# Patient Record
Sex: Male | Born: 2000 | Hispanic: No | Marital: Single | Smoking: Never smoker
Health system: Southern US, Community
[De-identification: ages and names within clinical notes are randomized; demographics above are authoritative.]

## PROBLEM LIST (undated history)

## (undated) DIAGNOSIS — G4733 Obstructive sleep apnea (adult) (pediatric): Secondary | ICD-10-CM

## (undated) DIAGNOSIS — E669 Obesity, unspecified: Secondary | ICD-10-CM

## (undated) DIAGNOSIS — R7303 Prediabetes: Secondary | ICD-10-CM

## (undated) HISTORY — DX: Obesity, unspecified: E66.9

## (undated) HISTORY — DX: Prediabetes: R73.03

## (undated) HISTORY — PX: MYRINGOTOMY: SUR874

## (undated) HISTORY — DX: Obstructive sleep apnea (adult) (pediatric): G47.33

## (undated) HISTORY — DX: Morbid (severe) obesity due to excess calories: E66.01

---
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
## 2019-02-02 ENCOUNTER — Other Ambulatory Visit: Payer: Self-pay

## 2019-02-02 ENCOUNTER — Encounter: Payer: Medicaid Other | Attending: Nurse Practitioner | Admitting: Registered"

## 2019-02-02 ENCOUNTER — Encounter: Payer: Self-pay | Admitting: Registered"

## 2019-02-02 DIAGNOSIS — R7303 Prediabetes: Secondary | ICD-10-CM | POA: Insufficient documentation

## 2019-02-02 NOTE — Patient Instructions (Signed)
Instructions/Goals:  Make sure to get in three meals per day. Try to have balanced meals like the My Plate example (see handout). Include lean proteins, vegetables, fruits, and whole grains at meals.   Starting Goal: Include a non-starchy vegetable at least 1 time per day (see list).  Try to include more water and less sugar sweetened beverages.  Try to limit high sodium foods:   5% or less daily value per serving  = low   20% or greater daily value per serving = high    Make physical activity a part of your week. Try to include at least 30 minutes of physical activity 5 days each week or at least 150 minutes per week. Regular physical activity promotes overall health-including helping to reduce risk for heart disease and diabetes, promoting mental health, and helping [place] sleep better.   Starting Goal: walk 4 days per week for 30-60 minutes each time.

## 2019-02-02 NOTE — Progress Notes (Signed)
Medical Nutrition Therapy:  Appt start time: 0930 end time:  1030.  Assessment:  Primary concerns today: Pt referred due to prediabetes and weight management. Pt present for appointment alone. Pt's mother waited in lobby. Pt is taking Metformin for elevated blood sugar and is taking lasix for swelling. Pt reports he is here to learn how to help prevent his prediabetes from developing into diabetes.   Sleep Routine: Wakes around 10:30 AM typically, goes to bed around 2 AM. Pt gets around 8.5 hours sleep. Denies any trouble sleeping. Pt is supposed to be scheduled for a sleep apnea screening.   Food Allergies/Intolerances: None reported.   GI Concerns: None reported.   Pertinent Lab Values: 12/27/18: Hgb A1c: 6.2  Weight Hx: See chart.   Preferred Learning Style:   No preference indicated   Learning Readiness:   Ready  MEDICATIONS: See list. Reviewed.    DIETARY INTAKE:  Usual eating pattern includes 3 meals and 1 snack per day.   Common foods: eggs.  Avoided foods: rice; most vegetables.     Typical Snacks: chips.     Typical Beverages: ~32 oz water; lemonade.  Location of Meals: kitchen at a table. Breakfast and lunch alone and dinner with family.   Electronics Present at [employer]: Yes: phone  Preferred/Accepted Foods:  Grains/Starches: likes whole grains. Most grains and starches apart from rice.  Proteins: most meats and beans, peanut butter.  Vegetables: starchy vegetables-potatoes, corn, beans. Non-starchy: green beans   Fruits: most fruits.  Dairy: accepts most dairy.  Beverages: water; lemonade; will drink milk  24-hr recall: Woke up at 1 PM  B ( AM): None reported.  Snk ( AM): None reported.  L (1 PM): 3 pieces of sausage, 2 eggs, 2 pieces white bread toasted, orange juice Snk ( PM): None reported.  D ( PM): [employer], fries, chicken nuggets, sweet tea Snk ( PM): None reported.  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    Progress Towards Goal(s):  In progress.   Nutritional Diagnosis:  NB-1.1 Food and nutrition-related knowledge deficit As related to relationship between insulin resistance/prediabetes and dietary intake .  As evidenced by no prior prediabetes education . NI-5.11.1 Predicted suboptimal nutrient intake As related to vegetables and whole grians.  As evidenced by pt's reported dietary intake and habits .    Intervention:  Nutrition counseling provided. Dietitian discussed pt's hgb a1c and relationship between insulin resistance/blood sugar and dietary intake. Provided education on balanced nutrition goals and benefits of physical activity on overall health and blood sugar management. Discussed limiting high sodium foods d/t fluid retention and provided education on reading labels to choose lower sodium foods. Worked with pt to set pt led goals. Pt appeared agreeable to information/goals discussed.   Instructions/Goals:  Make sure to get in three meals per day. Try to have balanced meals like the My Plate example (see handout). Include lean proteins, vegetables, fruits, and whole grains at meals.   Starting Goal: Include a non-starchy vegetable at least 1 time per day (see list).  Try to include more water and less sugar sweetened beverages.  Try to limit high sodium foods:   5% or less daily value per serving  = low   20% or greater daily value per serving = high    Make physical activity a part of your week. Try to include  at least 30 minutes of physical activity 5 days each week or at least 150 minutes per week. Regular physical activity promotes overall health-including helping to reduce risk for heart disease and diabetes, promoting mental health, and helping us sleep better.   Starting Goal: walk 4 days per week for 30-60 minutes each time.   Teaching Method Utilized:   Visual Auditory  Handouts given during visit include:  Balanced plate and food list.   Barriers to learning/adherence to lifestyle change: None indicated.   Demonstrated degree of understanding via:  Teach Back   Monitoring/Evaluation:  Dietary intake, exercise, and body weight in 1 month(s).

## 2019-03-02 ENCOUNTER — Encounter: Payer: Medicaid Other | Attending: Nurse Practitioner | Admitting: Registered"

## 2019-03-02 ENCOUNTER — Encounter: Payer: Self-pay | Admitting: Registered"

## 2019-03-02 ENCOUNTER — Other Ambulatory Visit: Payer: Self-pay

## 2019-03-02 DIAGNOSIS — R7303 Prediabetes: Secondary | ICD-10-CM | POA: Insufficient documentation

## 2019-03-02 NOTE — Progress Notes (Signed)
Medical Nutrition Therapy:  Appt start time: 0935 end time:  1005.  Assessment:  Primary concerns today: Pt referred due to prediabetes and weight management. Pt present for appointment alone. Reports things have been going well. Reports he has been including a vegetable (green beans mostly) at least one time a day. Reports he tried raw carrots, broccoli, spinach and lettuce. Reports he liked spinach and lettuce. Pt reports he tried some new fruits as well- kiwi, mango, and pears. Reports he liked the pears and kiwi. Pt reports it feels good to get in more fruits and vegetables. Reports including more fiber-whole grains and has been watching sodium intake. Pt reports he has been using [employer] to watch his intake including his sodium intake. Pt reports he feels is swelling may be some better.   Pt is taking 4 classes with [employer] online. 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  Sleep Routine: Wakes around 10:30 AM typically, goes to bed around 2 AM. Pt gets around 8.5 hours sleep. Denies any trouble sleeping. Pt is supposed to be scheduled for a sleep apnea screening.   Food Allergies/Intolerances: None reported.   GI Concerns: None reported.   Pertinent Lab Values: 12/27/18: Hgb A1c: 6.2  Weight Hx: See chart.   Preferred Learning Style:   No preference indicated   Learning Readiness:   Ready  MEDICATIONS: See list. Reviewed.    DIETARY INTAKE:  Usual eating pattern includes 3 meals and 1 snack per day.   Common foods: eggs.  Avoided foods: rice; most vegetables.     Typical Snacks: chips.     Typical Beverages: ~32 oz water; lemonade.  Location of Meals: kitchen at a table. Breakfast and lunch alone and dinner with family.   Electronics Present at [employer]: Yes: phone  Preferred/Accepted Foods:  Grains/Starches: likes whole grains. Most grains and starches apart from rice.  Proteins: most meats and beans, peanut butter.  Vegetables: starchy vegetables-potatoes, corn,  beans. Non-starchy: green beans, spinach, lettuce, peppers.    Fruits: most fruits.  Dairy: accepts most dairy.  Beverages: water; lemonade; will drink milk  24-hr recall: Woke up around 1 PM B ( AM): None reported.  Snk ( AM): None reported.  L (1 PM): bowl of Honey Nut Cheerios cereal with 1% milk Snk ( PM): None reported.  D ( PM):  2 eggs, salsa, 1 hawaiian roll, 15 calorie low sugar juice  Snk ( PM): 1 bottle water  Beverages: has been trying to include ~32-48 oz water; lower sugar juice  Usual physical activity: walking Minutes/Week: Pt has been trying to include 30 minutes walking x 2-3 days per week.   Progress Towards Goal(s):  Some progress. Pt is now working to include more whole grains and one vegetable serving most days.    Nutritional Diagnosis:  NB-1.1 Food and nutrition-related knowledge deficit As related to relationship between insulin resistance/prediabetes and dietary intake .  As evidenced by no prior prediabetes education . NI-5.11.1 Predicted suboptimal nutrient intake As related to vegetables and whole grians.  As evidenced by pt's reported dietary intake and habits .    Intervention:  Nutrition counseling provided. Dietitian praised pt's progress with working to include more vegetables, fruits, and whole grains, watching sodium intake, and working to increase physical activity. Discussed continuing to try new vegetables and fruits and to include those he likes regularly in his diet. Discussed continuing to work in more activity. Discussed continuing previously discussed goals. Pt appeared agreeable to information/goals discussed.   Instructions/Goals:  Make sure to get in three meals per day. Try to have balanced meals like the My Plate example (see handout). Include lean proteins, vegetables, fruits, and whole grains at meals.   Have 3 meals per day/avoid going more than 5 hours without eating while awake.  Starting Goal: Include a non-starchy vegetable 1-2  times per day. Continue trying new vegetables and fruits. Great job! Include those you like regularly and continue working to add more to your list. It can take multiple tries before we like a new food.  Starting Water Goal: include at least 48 oz (3 bottles of water) per day. Try to include more water and less sugar sweetened beverages.  Continue limiting high sodium foods:   5% or less daily value per serving  = low   20% or greater daily value per serving = high    Make physical activity a part of your week. Try to include at least 30 minutes of physical activity 5 days each week or at least 150 minutes per week. Regular physical activity promotes overall health-including helping to reduce risk for heart disease and diabetes, promoting mental health, and helping [place] sleep better.   Continue Goal: walk 4 days per week for 30-60 minutes each time.   Teaching Method Utilized:  Visual Auditory  Barriers to learning/adherence to lifestyle change: None indicated.   Demonstrated degree of understanding via:  Teach Back   Monitoring/Evaluation:  Dietary intake, exercise, and body weight in 1 month(s).

## 2019-03-02 NOTE — Patient Instructions (Addendum)
Instructions/Goals:  Make sure to get in three meals per day. Try to have balanced meals like the My Plate example (see handout). Include lean proteins, vegetables, fruits, and whole grains at meals.   Have 3 meals per day/avoid going more than 5 hours without eating while awake.  Starting Goal: Include a non-starchy vegetable 1-2 times per day. Continue trying new vegetables and fruits. Great job! Include those you like regularly and continue working to add more to your list. It can take multiple tries before we like a new food.  Starting Water Goal: include at least 48 oz (3 bottles of water) per day. Try to include more water and less sugar sweetened beverages.  Continue limiting high sodium foods:   5% or less daily value per serving  = low   20% or greater daily value per serving = high    Make physical activity a part of your week. Try to include at least 30 minutes of physical activity 5 days each week or at least 150 minutes per week. Regular physical activity promotes overall health-including helping to reduce risk for heart disease and diabetes, promoting mental health, and helping [place] sleep better.   Continue Goal: walk 4 days per week for 30-60 minutes each time.

## 2019-03-08 ENCOUNTER — Other Ambulatory Visit: Payer: Self-pay | Admitting: Nurse Practitioner

## 2019-03-08 DIAGNOSIS — M549 Dorsalgia, unspecified: Secondary | ICD-10-CM

## 2019-03-08 DIAGNOSIS — R350 Frequency of micturition: Secondary | ICD-10-CM

## 2019-03-09 ENCOUNTER — Ambulatory Visit
Admission: RE | Admit: 2019-03-09 | Discharge: 2019-03-09 | Disposition: A | Discharge status: Home / Self Care | Payer: Medicaid Other | Source: Ambulatory Visit | Attending: Nurse Practitioner | Admitting: Nurse Practitioner

## 2019-03-09 DIAGNOSIS — R350 Frequency of micturition: Secondary | ICD-10-CM

## 2019-03-09 DIAGNOSIS — M549 Dorsalgia, unspecified: Secondary | ICD-10-CM

## 2019-03-18 ENCOUNTER — Other Ambulatory Visit (HOSPITAL_BASED_OUTPATIENT_CLINIC_OR_DEPARTMENT_OTHER): Payer: Self-pay

## 2019-03-18 DIAGNOSIS — R5383 Other fatigue: Secondary | ICD-10-CM

## 2019-03-18 DIAGNOSIS — E669 Obesity, unspecified: Secondary | ICD-10-CM

## 2019-03-18 DIAGNOSIS — R0683 Snoring: Secondary | ICD-10-CM

## 2019-03-24 ENCOUNTER — Other Ambulatory Visit (HOSPITAL_COMMUNITY)
Admission: RE | Admit: 2019-03-24 | Discharge: 2019-03-24 | Disposition: A | Discharge status: Home / Self Care | Payer: Medicaid Other | Source: Ambulatory Visit | Attending: Internal Medicine | Admitting: Internal Medicine

## 2019-03-24 DIAGNOSIS — Z20828 Contact with and (suspected) exposure to other viral communicable diseases: Secondary | ICD-10-CM | POA: Insufficient documentation

## 2019-03-24 DIAGNOSIS — Z01812 Encounter for preprocedural laboratory examination: Secondary | ICD-10-CM | POA: Diagnosis present

## 2019-03-25 LAB — NOVEL CORONAVIRUS, NAA (HOSP ORDER, SEND-OUT TO REF LAB; TAT 18-24 HRS): SARS-CoV-2, NAA: NOT DETECTED

## 2019-03-27 ENCOUNTER — Ambulatory Visit (HOSPITAL_BASED_OUTPATIENT_CLINIC_OR_DEPARTMENT_OTHER): Payer: Medicaid Other | Attending: Nurse Practitioner | Admitting: Internal Medicine

## 2019-03-27 ENCOUNTER — Other Ambulatory Visit: Payer: Self-pay

## 2019-03-27 VITALS — Ht 68.0 in | Wt >= 6400 oz

## 2019-03-27 DIAGNOSIS — G4733 Obstructive sleep apnea (adult) (pediatric): Secondary | ICD-10-CM | POA: Diagnosis not present

## 2019-03-27 DIAGNOSIS — R5383 Other fatigue: Secondary | ICD-10-CM | POA: Insufficient documentation

## 2019-03-27 DIAGNOSIS — R0683 Snoring: Secondary | ICD-10-CM

## 2019-03-27 DIAGNOSIS — E669 Obesity, unspecified: Secondary | ICD-10-CM

## 2019-04-02 DIAGNOSIS — R0683 Snoring: Secondary | ICD-10-CM

## 2019-04-02 NOTE — Procedures (Signed)
Patient Name: James Gross, James Gross Date: 03/27/2019 Gender: Male D.O.B: 2000/07/10 Age (years): 17 Referring Provider: Baird Lyons MD, ABSM Height (inches): 68 Interpreting Physician: Baird Lyons MD, ABSM Weight (lbs): 430 RPSGT: Gwenyth Allegra BMI: 65 MRN: [id number] Neck Size: 18.50  CLINICAL INFORMATION Sleep Study Type: Split Night CPAP Indication for sleep study: Fatigue, Obesity, Snoring  Epworth Sleepiness Score: 4  SLEEP STUDY TECHNIQUE As per the AASM Manual for the Scoring of Sleep and Associated Events v2.3 (April 2016) with a hypopnea requiring 4% desaturations.  The channels recorded and monitored were frontal, central and occipital EEG, electrooculogram (EOG), submentalis EMG (chin), nasal and oral airflow, thoracic and abdominal wall motion, anterior tibialis EMG, snore microphone, electrocardiogram, and pulse oximetry. Continuous positive airway pressure (CPAP) was initiated when the patient met split night criteria and was titrated according to treat sleep-disordered breathing.  MEDICATIONS Medications self-administered by patient taken the night of the study : none reported  RESPIRATORY PARAMETERS Diagnostic  Total AHI (/hr): 169.9 RDI (/hr): 171.8 OA Index (/hr): 135.6 CA Index (/hr): 0.0 REM AHI (/hr): 158.8 NREM AHI (/hr): 173.8 Supine AHI (/hr): 170.0 Non-supine AHI (/hr): 169.9 Min O2 Sat (%): 66.0 Mean O2 (%): 83.7 Time below 88% (min): 90.4   Titration  Optimal Pressure (cm): 16 AHI at Optimal Pressure (/hr): 0.0 Min O2 at Optimal Pressure (%): 93.0 Supine % at Optimal (%): 99 Sleep % at Optimal (%): 93   SLEEP ARCHITECTURE The recording time for the entire night was 396.5 minutes.  During a baseline period of 156.4 minutes, the patient slept for 131.0 minutes in REM and nonREM, yielding a sleep efficiency of 83.8%%. Sleep onset after lights out was 16.8 minutes with a REM latency of 52.0 minutes. The patient spent 3.1%% of the night in  stage N1 sleep, 71.0%% in stage N2 sleep, 0.0%% in stage N3 and 26% in REM.  During the titration period of 238.0 minutes, the patient slept for 201.5 minutes in REM and nonREM, yielding a sleep efficiency of 84.7%%. Sleep onset after CPAP initiation was 29.8 minutes with a REM latency of 22.0 minutes. The patient spent 1.5%% of the night in stage N1 sleep, 40.0%% in stage N2 sleep, 13.9%% in stage N3 and 44.7% in REM.  CARDIAC DATA The 2 lead EKG demonstrated sinus rhythm. The mean heart rate was 100.0 beats per minute. Other EKG findings include: None.  LEG MOVEMENT DATA The total Periodic Limb Movements of Sleep (PLMS) were 0. The PLMS index was 0.0 .  IMPRESSIONS - Severe obstructive sleep apnea occurred during the diagnostic portion of the study (AHI = 169.9/hour). An optimal PAP pressure was selected for this patient ( 16 cm of water) - No significant central sleep apnea occurred during the diagnostic portion of the study (CAI = 0.0/hour). - The patient had oxygen desaturation during the diagnostic portion of the study (Min O2 = 66.0%) - Minimum O2 sat at CPAP 16 was 93%. - The patient snored with loud snoring volume during the diagnostic portion of the study. - No cardiac abnormalities were noted during this study. - Clinically significant periodic limb movements did not occur during sleep.  DIAGNOSIS - Obstructive Sleep Apnea (327.23 [G47.33 ICD-10])  RECOMMENDATIONS - Trial of CPAP therapy on 16 cm H2O or autopap 12-18. - Patient used a Medium size Fisher[employer]Paykel Full Face Mask Simplus mask and heated humidification. - Be careful with alcohol, sedatives and other CNS depressants that may worsen sleep apnea and disrupt normal sleep architecture. -  Sleep hygiene should be reviewed to assess factors that may improve sleep quality. - Weight management and regular exercise should be initiated or continued.  [Electronically signed] 04/02/2019 11:28 AM  Baird Lyons MD, ABSM  Diplomate, American Board of Sleep Medicine   NPI: [id number]                         [place], [facility]  ELECTRONICALLY SIGNED ON:  04/02/2019, 11:25 AM [place] PH: [phone]   FX: [phone] [place]

## 2019-04-06 ENCOUNTER — Other Ambulatory Visit: Payer: Self-pay

## 2019-04-06 ENCOUNTER — Encounter: Payer: Medicaid Other | Attending: Nurse Practitioner | Admitting: Registered"

## 2019-04-06 DIAGNOSIS — R7303 Prediabetes: Secondary | ICD-10-CM | POA: Diagnosis present

## 2019-04-06 NOTE — Patient Instructions (Signed)
Instructions/Goals:  Make sure to get in three meals per day. Try to have balanced meals like the My Plate example (see handout). Include lean proteins, vegetables, fruits, and whole grains at meals.   Have 3 meals per day/avoid going more than 5 hours without eating while awake. -Great job working to eat 3 meals per day!  Continue Goal: Include a non-starchy vegetable at least 1 time per day Continue trying new vegetables and fruits. Great job! Include those you like regularly and continue working to add more to your list. It can take multiple tries before we like a new food.  Starting Water Goal: include at least 48 oz (3 bottles of water) per day. Great job cutting out sugary drinks! Try to include at least 32 oz plain water and between all fluids at least 64 oz per day.    Continue limiting high sodium foods:   5% or less daily value per serving  = low   20% or greater daily value per serving = high    Make physical activity a part of your week. Try to include at least 30 minutes of physical activity 5 days each week or at least 150 minutes per week. Regular physical activity promotes overall health-including helping to reduce risk for heart disease and diabetes, promoting mental health, and helping [place] sleep better.   Continue Goal: include physical activity as able. Goal once able to continue with regular activity includes: walking 4 days per week for 30-60 minutes each time.

## 2019-04-06 NOTE — Progress Notes (Signed)
Medical Nutrition Therapy:  Appt start time: 0931 end time:  947.  Assessment:  Primary concerns today: Pt referred due to prediabetes and weight management. Pt present for appointment alone. Pt reports he has not been able to do his exercises d/t pain in leg over past month. Reports it hurts to walk. Reports his doctor referred him to someone to help with it. Pt is unsure who it is. Pt reports he has improved with doing 3 meals per day. Reports he has not increased fruit and vegetable intake but has been including a fruit or vegetable at least 1 time per day. Reports he has cut out all sugary drinks and now only drinks water, sugar free beverages, or milk. Reports he made this change about 3 weeks ago. Reports it was difficult at first, the first week, but now has been easier. Reports he has lost some motivation to be active d/t his leg but since it has started getting some better, he is getting back some motivation. Pt completed his sleep study earlier this month and was dx with sleep apnea. Reports he is supposed to receive his CPAP next week.   Pt is taking 4 classes with [employer] online. 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 Pt reports classes are easy and he is used to doing online work but he does feel he is missing some of the college experience with not being on campus.   Sleep Routine: Wakes around 10:30 AM typically, goes to bed around 2 AM. Pt gets around 8.5 hours sleep. Denies any trouble sleeping. Pt had a sleep study and was dx with sleep apnea. Reports he will get his CPAP next week.   Food Allergies/Intolerances: None reported.   GI Concerns: None reported.   Pertinent Lab Values: 12/27/18: Hgb A1c: 6.2  Weight Hx: See chart.   Preferred Learning Style:   No preference indicated   Learning Readiness:   Ready  MEDICATIONS: See list. Reviewed.    DIETARY INTAKE:  Usual eating pattern includes 3 meals and 1 snack per day.   Common foods: eggs.  Avoided foods: rice; most  vegetables.     Typical Snacks: chips.     Typical Beverages: at least 32 oz water; 16 oz SF Gatorade 3 times per week.   Location of Meals: kitchen at a table. Breakfast and lunch alone and dinner with family.   Electronics Present at [employer]: Yes: phone  Preferred/Accepted Foods:  Grains/Starches: likes whole grains. Most grains and starches apart from rice.  Proteins: most meats and beans, peanut butter.  Vegetables: starchy vegetables-potatoes, corn, beans. Non-starchy: green beans, spinach, lettuce, peppers.    Fruits: most fruits.  Dairy: accepts most dairy.  Beverages: water; milk; sugar free beverages  24-hr recall: Woke up around 9 AM B ( AM): None reported.  Snk ( AM): None reported.  L (10 PM): half BBQ sandwich and fries, water   Snk (2 PM): tuna pack, crackers, apple, water   D 6-7( PM): tilapia, shrimp baked potato, water  Snk ( PM): None reported.  Beverages: has been trying to include at least 32 oz water; lower sugar juice  Usual physical activity: walking Minutes/Week: Pt has been trying to include some functional activities such as painting and mowing the lawn but has not been including planned d/t leg pain.   Progress Towards Goal(s):  Some progress. Pt is now including a fruit or vegetable each day, eating 3 meals per day, and not drinking any sugary drinks.  Nutritional Diagnosis:  NB-1.1 Food and nutrition-related knowledge deficit As related to relationship between insulin resistance/prediabetes and dietary intake .  As evidenced by no prior prediabetes education . NI-5.11.1 Predicted suboptimal nutrient intake As related to vegetables and whole grians.  As evidenced by pt's reported dietary intake and habits .    Intervention:  Nutrition counseling provided. Dietitian praised pt's progress with working to avoid sugary beverages and have more consistent eating pattern. Discussed fluid goals. Encouraged pt to continue working on goals to increase  vegetables. Discussed he can wait to start back with activity goal until after he has appointment about his leg if it continues to bother him. Discussed including activities that do not exacerbate leg. Pt appeared agreeable to information/goals discussed.   Instructions/Goals:  Make sure to get in three meals per day. Try to have balanced meals like the My Plate example (see handout). Include lean proteins, vegetables, fruits, and whole grains at meals.   Have 3 meals per day/avoid going more than 5 hours without eating while awake. -Great job working to eat 3 meals per day!  Continue Goal: Include a non-starchy vegetable at least 1 time per day Continue trying new vegetables and fruits. Great job! Include those you like regularly and continue working to add more to your list. It can take multiple tries before we like a new food.  Starting Water Goal: include at least 48 oz (3 bottles of water) per day. Great job cutting out sugary drinks! Try to include at least 32 oz plain water and between all fluids at least 64 oz per day.   Continue limiting high sodium foods:   5% or less daily value per serving  = low   20% or greater daily value per serving = high    Make physical activity a part of your week. Try to include at least 30 minutes of physical activity 5 days each week or at least 150 minutes per week. Regular physical activity promotes overall health-including helping to reduce risk for heart disease and diabetes, promoting mental health, and helping [place] sleep better.   Continue Goal: include physical activity as able. Goal once able to continue with regular activity includes: walking 4 days per week for 30-60 minutes each time.   Teaching Method Utilized:  Visual Auditory  Barriers to learning/adherence to lifestyle change: None indicated.   Demonstrated degree of understanding via:  Teach Back   Monitoring/Evaluation:  Dietary intake, exercise, and body weight in 1 month(s).

## 2019-05-11 ENCOUNTER — Other Ambulatory Visit: Payer: Self-pay

## 2019-05-11 ENCOUNTER — Encounter: Payer: Medicaid Other | Attending: Nurse Practitioner | Admitting: Registered"

## 2019-05-11 DIAGNOSIS — R7303 Prediabetes: Secondary | ICD-10-CM | POA: Diagnosis present

## 2019-05-11 NOTE — Patient Instructions (Signed)
Instructions/Goals:  Make sure to get in three meals per day. Try to have balanced meals like the My Plate example (see handout). Include lean proteins, vegetables, fruits, and whole grains at meals.   Have 3 meals per day/avoid going more than 5 hours without eating while awake. -Great job working to eat 3 meals per day!  Continue Goal: Include a non-starchy vegetable at least 1 time per day Recommend planning out meals ahead of time and having easy go-to fruits and vegetables at home. Having frozen vegetables and steamable vegetable packs can make adding in veggies easier.  Starting Water Goal: include at least 64 oz (4 bottles of water) per day.    Continue limiting high sodium foods:   5% or less daily value per serving  = low   20% or greater daily value per serving = high    Make physical activity a part of your week. Try to include at least 30 minutes of physical activity 5 days each week or at least 150 minutes per week. Regular physical activity promotes overall health-including helping to reduce risk for heart disease and diabetes, promoting mental health, and helping [place] sleep better.   Continue Goal: include physical activity as able. Goal once able to continue with regular activity includes: walking or other activities that work well at this time x 4 days per week for 30-60 minutes each time.    Follow-up with doctor regarding your leg pain.

## 2019-05-11 NOTE — Progress Notes (Signed)
Medical Nutrition Therapy:  Appt start time: 1041 end time:  1100.  Assessment:  Primary concerns today: Pt referred due to prediabetes and weight management. Pt present for appointment alone. Pt reports he has not yet received his CPAP machine. Reports he has been waking up during the night and feels tired often. Reports walking has still been limited due to leg pain, but it is improving. Reports he needs to f/u with his doctor about his leg. Reports nutrition has been going ok, ups and downs. Reports water intake is going well and including low sodium foods per pt. Reports he has been having more "cheat days" due to moving process which has led to eating fast food more often. Reports his family has storage units which they have been trying to move things out from. Reports school is going well and semester will end around end of the month. Reports fruit and vegetable intake could be better. Pt says he is getting the 3 meals but less consistent as far when he eats depending on sleep pattern. Reports he has been trying to eat at consistent intervals.   Pt is taking 4 classes with [employer] online. 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   Sleep Routine:  Pt reports he has not yet received his CPAP machine. Reports he has been waking up during the night and feels tired often.  Food Allergies/Intolerances: None reported.   GI Concerns: None reported.   Pertinent Lab Values: 12/27/18: Hgb A1c: 6.2  Weight Hx: See chart.   Preferred Learning Style:   No preference indicated   Learning Readiness:   Ready  MEDICATIONS: See list. Reviewed.    DIETARY INTAKE:  Usual eating pattern includes 3 meals and 1 snack per day.   Common foods: eggs.  Avoided foods: rice; some vegetables.     Typical Snacks: chips.     Typical Beverages: 32-48 oz water; diet soda; milk  Location of Meals: kitchen at a table. Breakfast and lunch alone and dinner with family.   Electronics Present at [employer]: Yes:  phone  Preferred/Accepted Foods:  Grains/Starches: likes whole grains. Most grains and starches apart from rice.  Proteins: most meats and beans, peanut butter.  Vegetables: starchy vegetables-potatoes, corn, beans. Non-starchy: green beans, spinach, lettuce, peppers.    Fruits: most fruits.  Dairy: accepts most dairy.  Beverages: water; milk; sugar free beverages  24-hr recall: Woke up around B ( AM): 2 eggs, white toast, 2% milk Snk ( AM): None reported.  L ( PM): burger and fries, diet soda Snk ( PM): None reported.  D ( PM): grilled chicken spaghetti, water (homemade)  Snk ( PM): None reported.  Beverages: 2-3 bottles of water daily (32-48 oz); milk  Usual physical activity: lifting boxes with moving, none consistently. Minutes/Week: N/A  Progress Towards Goal(s):  Some progress.   Nutritional Diagnosis:  NB-1.1 Food and nutrition-related knowledge deficit As related to relationship between insulin resistance/prediabetes and dietary intake .  As evidenced by no prior prediabetes education . NI-5.11.1 Predicted suboptimal nutrient intake As related to vegetables and whole grians.  As evidenced by pt's reported dietary intake and habits .    Intervention:  Nutrition counseling provided. Dietitian praised pt's progress with working to eat at regular intervals. Encouraged pt to f/u with doctor about leg pain. Discussed planning out meals ahead of time and having easy go-to fruits and vegetables at home that he can add to his meals. Discussed handout with various exercises including arm exercises which pt may  be able to incorporate while unable to start back walking due to leg pain. Pt appeared agreeable to information/goals discussed.   Instructions/Goals:  Make sure to get in three meals per day. Try to have balanced meals like the My Plate example (see handout). Include lean proteins, vegetables, fruits, and whole grains at meals.   Have 3 meals per day/avoid going more than 5  hours without eating while awake. -Great job working to eat 3 meals per day!  Continue Goal: Include a non-starchy vegetable at least 1 time per day Recommend planning out meals ahead of time and having easy go-to fruits and vegetables at home. Having frozen vegetables and steamable vegetable packs can make adding in veggies easier.  Starting Water Goal: include at least 64 oz (4 bottles of water) per day.    Continue limiting high sodium foods:   5% or less daily value per serving  = low   20% or greater daily value per serving = high    Make physical activity a part of your week. Try to include at least 30 minutes of physical activity 5 days each week or at least 150 minutes per week. Regular physical activity promotes overall health-including helping to reduce risk for heart disease and diabetes, promoting mental health, and helping [place] sleep better.   Continue Goal: include physical activity as able. Goal once able to continue with regular activity includes: walking or other activities that work well at this time x 4 days per week for 30-60 minutes each time.    Follow-up with doctor regarding your leg pain.   Handouts Provided:   ADA Exercises  Teaching Method Utilized:  Visual Auditory  Barriers to learning/adherence to lifestyle change: None indicated.   Demonstrated degree of understanding via:  Teach Back   Monitoring/Evaluation:  Dietary intake, exercise, and body weight in 1 month(s).

## 2019-05-13 ENCOUNTER — Encounter: Payer: Self-pay | Admitting: Registered"

## 2019-06-16 ENCOUNTER — Ambulatory Visit: Payer: Medicaid Other | Admitting: Registered"

## 2019-09-02 ENCOUNTER — Telehealth: Payer: Self-pay

## 2019-09-02 NOTE — Telephone Encounter (Signed)
Spoke with patient's mom(Deborah) regarding her son's CPAP and f/u with our office with in the next 90 days per Dr.Young. Made patient a appt for f/u OSA. Nothing else Further needed at this time.

## 2019-09-02 NOTE — Telephone Encounter (Signed)
-----   Message from Waymon Budge, MD sent at 09/02/2019  2:30 PM EST -----   ----- Message ----- From: Waymon Budge, MD Sent: 04/02/2019  11:33 AM EST To: Waymon Budge, MD

## 2019-09-02 NOTE — Progress Notes (Signed)
Sleep study in October showed severe sleep apnea. He never mad f/u appointment  Please contact him, recommend we order new DME, new CPAP auto 5-20, mask of choice, humidifier, suppliess, AirView/ card  He needs office f/u with [place] in 231-90 days

## 2019-10-20 ENCOUNTER — Ambulatory Visit (INDEPENDENT_AMBULATORY_CARE_PROVIDER_SITE_OTHER): Payer: Medicaid Other | Admitting: Internal Medicine

## 2019-10-20 ENCOUNTER — Other Ambulatory Visit: Payer: Self-pay

## 2019-10-20 ENCOUNTER — Encounter: Payer: Self-pay | Admitting: Internal Medicine

## 2019-10-20 VITALS — BP 118/78 | HR 105 | Temp 97.7°F | Ht 67.0 in | Wt >= 6400 oz

## 2019-10-20 DIAGNOSIS — Z68.41 Body mass index (BMI) pediatric, greater than or equal to 95th percentile for age: Secondary | ICD-10-CM

## 2019-10-20 DIAGNOSIS — G4733 Obstructive sleep apnea (adult) (pediatric): Secondary | ICD-10-CM | POA: Diagnosis not present

## 2019-10-20 NOTE — Patient Instructions (Signed)
Order- DME [facility]- we can continue CPAP auto 12-18, EPR 3, mask of choice, humidifier, supplies, AirView/ card  Order- schedule mask fitting at sleep center   Dx OSA  Our goal is to be able to wear CPAP all night, every night and any time you sleep. If it is uncomfortable, please let us know so we can help

## 2019-10-20 NOTE — Progress Notes (Signed)
10/20/19- 18 yoM never smoker for sleep evaluation Consult OSA NPSG 03/27/19  AHI 169.9/ hr, desaturation to 66%, Body weight 430 lbs, CPAP titrated to 16 CPAP auto 12-18, compliance 23%, AHI 1.9/ hr. Medical problem list includes Morbid obesity, PreDiabetes,  Epworth score 3 Body weight today 465 lbs CPAP [facility] He complains of frequent, restless movement in sleep and heavy snoring.  Uses CPAP only occasionally, without real understanding. Mask is uncomfortable. 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 No sleep meds. Up once to bathroom. Up for the day around 9:00AM.  Prior to Admission medications   Medication Sig Start Date End Date Taking? Authorizing Provider  furosemide (LASIX) 20 MG tablet Take 20 mg by mouth.   Yes [provider]  HYDROcodone-acetaminophen (NORCO/VICODIN) 5-325 MG tablet Take 2 tablets by mouth every 4 (four) hours as needed for moderate pain. 08/11/16  Yes Leonia Corona, MD  METFORMIN HCL PO Take by mouth.   Yes [provider]  POTASSIUM CHLORIDE PO Take by mouth.   Yes [provider]  fluticasone (FLONASE) 50 MCG/ACT nasal spray Place 2 sprays into the nose daily. Patient not taking: Reported on 08/11/2016 06/29/11 06/28/12  Josefina Do., MD   Past Medical History:  Diagnosis Date  . Obesity   . Prediabetes    Past Surgical History:  Procedure Laterality Date  . LAPAROSCOPIC APPENDECTOMY N/A 08/11/2016   Procedure: APPENDECTOMY LAPAROSCOPIC;  Surgeon: Leonia Corona, MD;  Location: [facility] OR;  Service: General;  Laterality: N/A;  . MYRINGOTOMY     Family History  Problem Relation Age of Onset  . Hypertension Father   . Hyperlipidemia Father   . Hypertension Paternal Grandmother   . Diabetes Paternal Grandmother   . Obesity Paternal Grandmother   . Obesity Maternal Grandmother   . Obesity Paternal Grandfather    Social History   Socioeconomic History  . Marital status: Single    Spouse name: Not on file  . Number of  children: Not on file  . Years of education: Not on file  . Highest education level: Not on file  Occupational History  . Not on file  Tobacco Use  . Smoking status: Never Smoker  . Smokeless tobacco: Never Used  Substance and Sexual Activity  . Alcohol use: No  . Drug use: No  . Sexual activity: Not Currently    Birth control/protection: Abstinence  Other Topics Concern  . Not on file  Social History Narrative   10th grade [employer]    Social Determinants of Health   Financial Resource Strain:   . Difficulty of Paying Living Expenses:   Food Insecurity:   . Worried About Programme researcher, broadcasting/film/video in the Last Year:   . Barista in the Last Year:   Transportation Needs:   . Freight forwarder (Medical):   [name] Lack of Transportation (Non-Medical):   Physical Activity:   . Days of Exercise per Week:   . Minutes of Exercise per Session:   Stress:   . Feeling of Stress :   Social Connections:   . Frequency of Communication with Friends and Family:   . Frequency of Social Gatherings with Friends and Family:   . Attends Religious Services:   . Active Member of Clubs or Organizations:   . Attends Banker Meetings:   [name] Marital Status:   Intimate Partner Violence:   . Fear of Current or Ex-Partner:   . Emotionally Abused:   [name] Physically  Abused:   . Sexually Abused:    ROS-see HPI   + = positive Constitutional:    weight loss, night sweats, fevers, chills, fatigue, lassitude. HEENT:    headaches, difficulty swallowing, tooth/dental problems, sore throat,       sneezing, itching, ear ache, nasal congestion, post nasal drip, snoring CV:    chest pain, orthopnea, PND, swelling in lower extremities, anasarca,                                  dizziness, palpitations Resp:   shortness of breath with exertion or at rest.                productive cough,   non-productive cough, coughing up of blood.              change in color of mucus.  wheezing.   Skin:     rash or lesions. GI:  No-   heartburn, indigestion, abdominal pain, nausea, vomiting, diarrhea,                 change in bowel habits, loss of appetite GU: dysuria, change in color of urine, no urgency or frequency.   flank pain. MS:   joint pain, stiffness, decreased range of motion, back pain. Neuro-     nothing unusual Psych:  change in mood or affect.  depression or anxiety.   memory loss.  OBJ- Physical Exam General- Alert, Oriented, Affect-appropriate, Distress- none acute, + morbid obesity Skin- rash-none, lesions- none, excoriation- none Lymphadenopathy- none Head- atraumatic            Eyes- Gross vision intact, PERRLA, conjunctivae and secretions clear            Ears- Hearing, canals-normal            Nose- Clear, no-Septal dev, mucus, polyps, erosion, perforation             Throat- Mallampati III-IV , mucosa clear , drainage- none, tonsils- atrophic, + teeth Neck- flexible , trachea midline, no stridor , thyroid nl, carotid no bruit Chest - symmetrical excursion , unlabored           Heart/CV- RRR , no murmur , no gallop  , no rub, nl s1 s2                           - JVD- none , edema- none, stasis changes- none, varices- none           Lung- clear to P&A, wheeze- none, cough- none , dullness-none, rub- none           Chest wall-  Abd-  Br/ Gen/ Rectal- Not done, not indicated Extrem- cyanosis- none, clubbing, none, atrophy- none, strength- nl Neuro- grossly intact to observation

## 2019-10-23 NOTE — Assessment & Plan Note (Addendum)
CPAP is the best treatment for now, with weight loss a long-term goal. Education about CPAP done, questions answered, basics of OSA reviewed and driving safety emphasized.  Plan- continue CPAP auto 12-18, mask fitting. Emphasize compliance goals.

## 2019-10-23 NOTE — Assessment & Plan Note (Signed)
Consider bariatric referral as an option.

## 2019-10-31 ENCOUNTER — Other Ambulatory Visit (HOSPITAL_COMMUNITY)
Admission: RE | Admit: 2019-10-31 | Discharge: 2019-10-31 | Disposition: A | Discharge status: Home / Self Care | Payer: Medicaid Other | Source: Ambulatory Visit | Attending: Internal Medicine | Admitting: Internal Medicine

## 2019-10-31 DIAGNOSIS — Z20822 Contact with and (suspected) exposure to covid-19: Secondary | ICD-10-CM | POA: Insufficient documentation

## 2019-10-31 DIAGNOSIS — Z01812 Encounter for preprocedural laboratory examination: Secondary | ICD-10-CM | POA: Insufficient documentation

## 2019-10-31 LAB — SARS CORONAVIRUS 2 (TAT 6-24 HRS): SARS Coronavirus 2: NEGATIVE

## 2019-11-01 ENCOUNTER — Ambulatory Visit (HOSPITAL_BASED_OUTPATIENT_CLINIC_OR_DEPARTMENT_OTHER): Payer: Medicaid Other | Attending: Internal Medicine | Admitting: Internal Medicine

## 2019-11-01 DIAGNOSIS — G4733 Obstructive sleep apnea (adult) (pediatric): Secondary | ICD-10-CM

## 2019-11-01 IMAGING — CT CT ABD-PELV W/O CM
2 of 4 series · 17 of 46 positions shown, 19 images · non-contrast
Comparison: None.

CLINICAL DATA: Right flank pain with hematuria

EXAM:
CT ABDOMEN AND PELVIS WITHOUT CONTRAST
TECHNIQUE: Multidetector CT imaging of the abdomen and pelvis was performed
following the standard protocol without IV contrast.

[Series 2: renal stone 5.00 br40 s3 axial · axial · 0.90mm/px · z∈[+1268,+1763]mm · 14 of 109 slices shown, 16 images]
[im 5/109  soft-tissue]
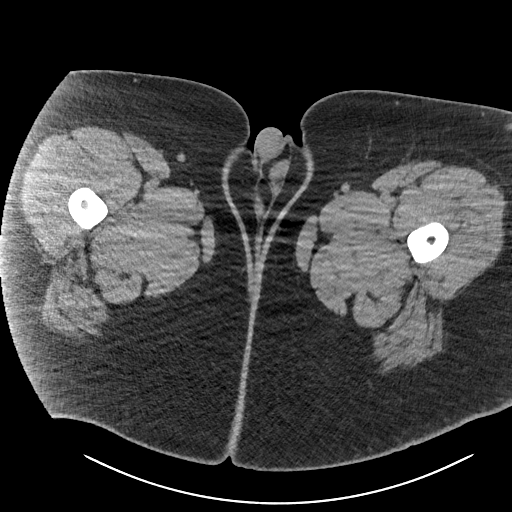
[im 5/109  bone]
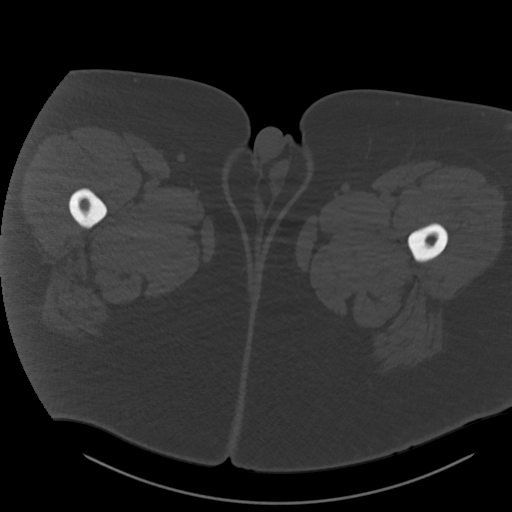
[im 14/109  soft-tissue]
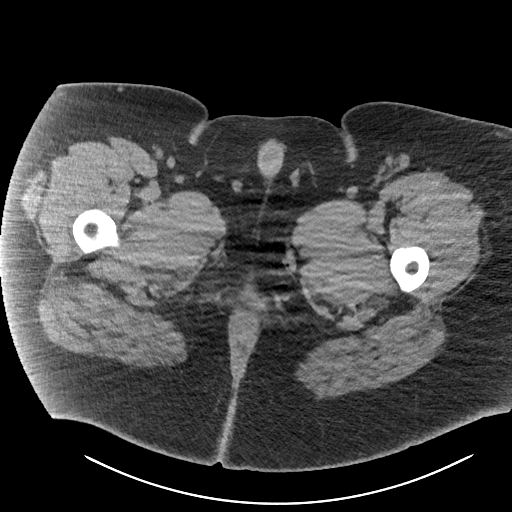
[im 23/109  soft-tissue]
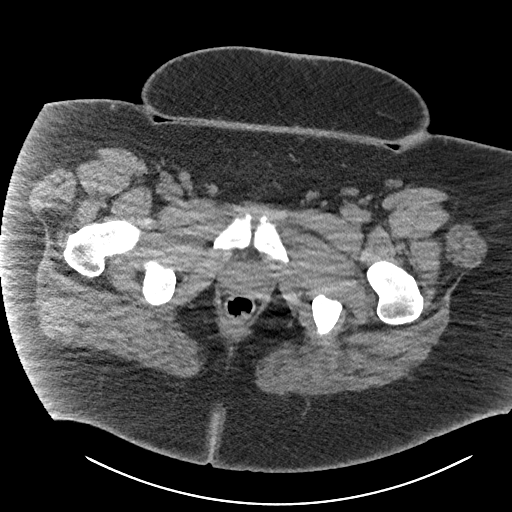
[im 28/109  soft-tissue]
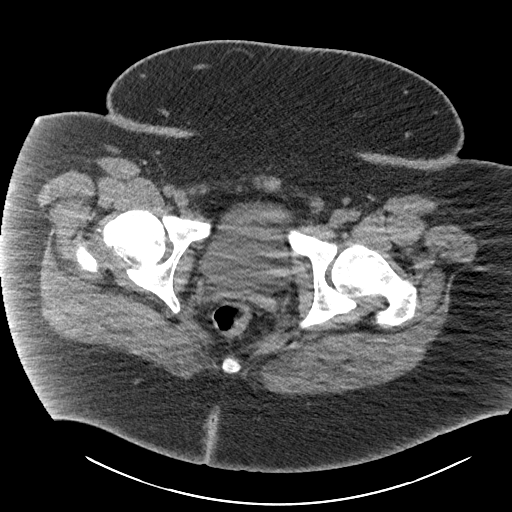
[im 37/109  soft-tissue]
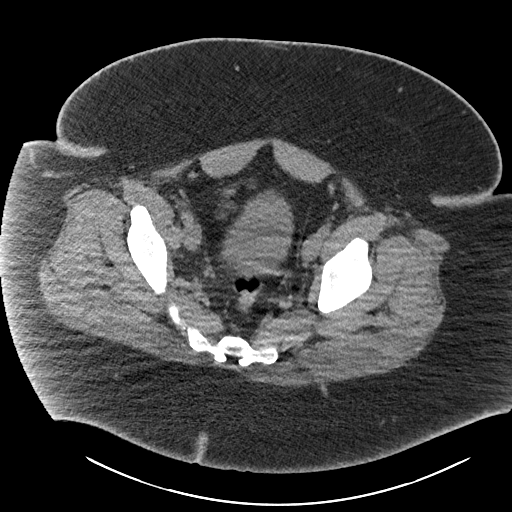
[im 46/109  soft-tissue]
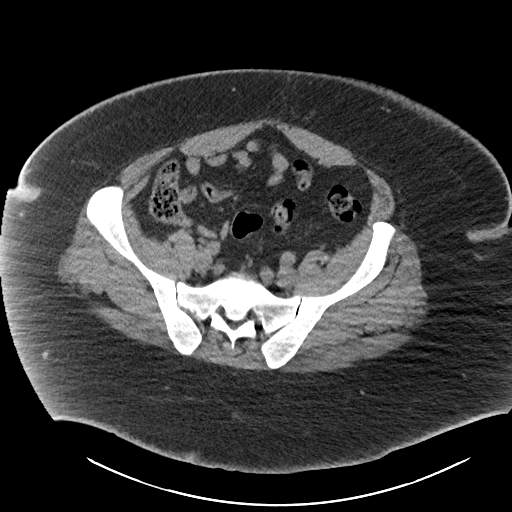
[im 50/109  soft-tissue]
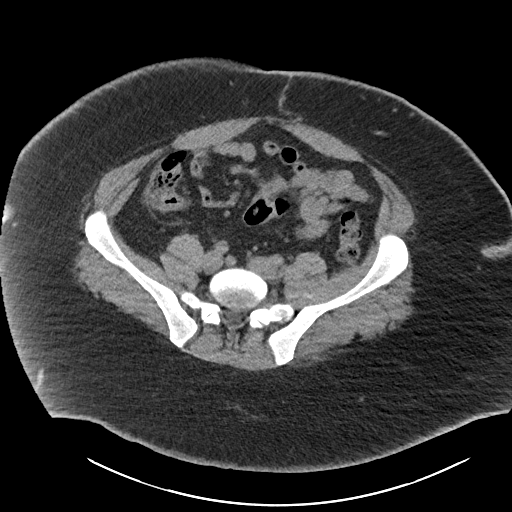
[im 59/109  soft-tissue]
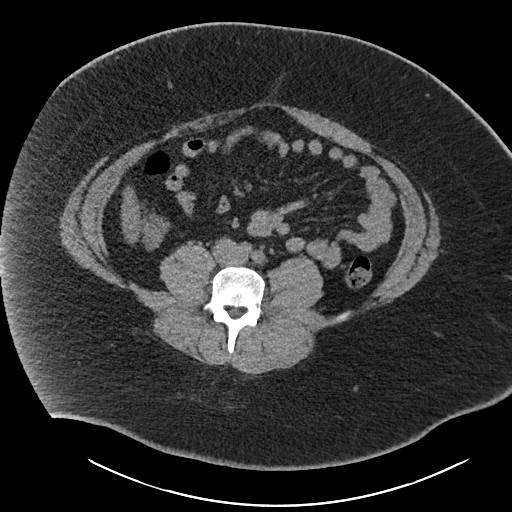
[im 64/109  soft-tissue]
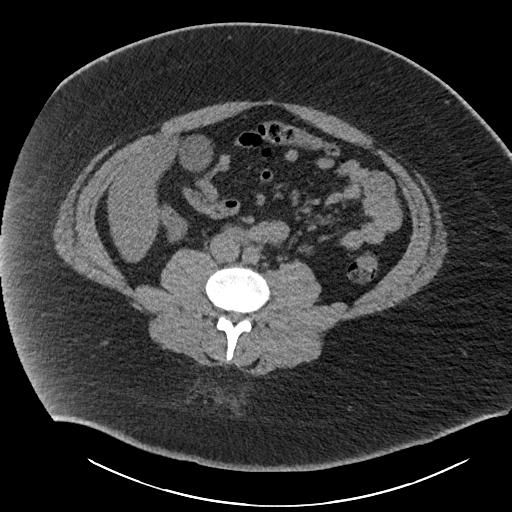
[im 64/109  bone]
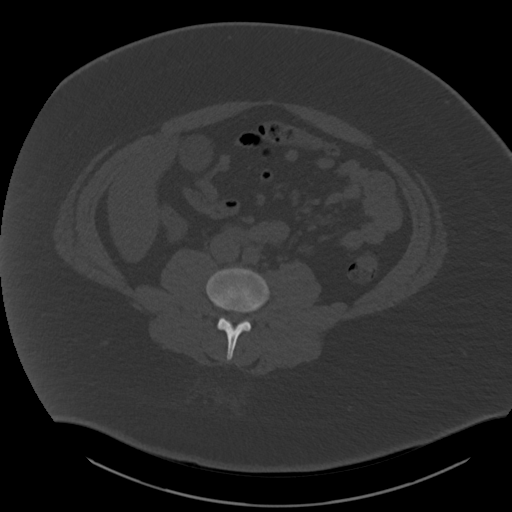
[im 73/109  soft-tissue]
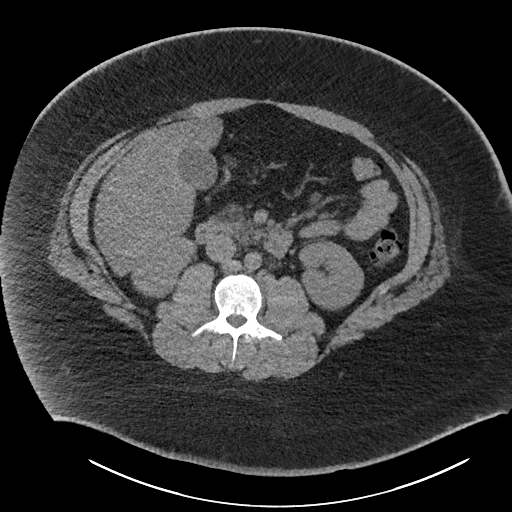
[im 82/109  soft-tissue]
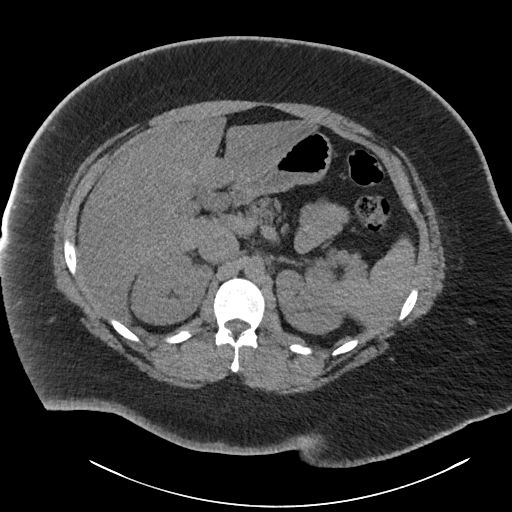
[im 86/109  soft-tissue]
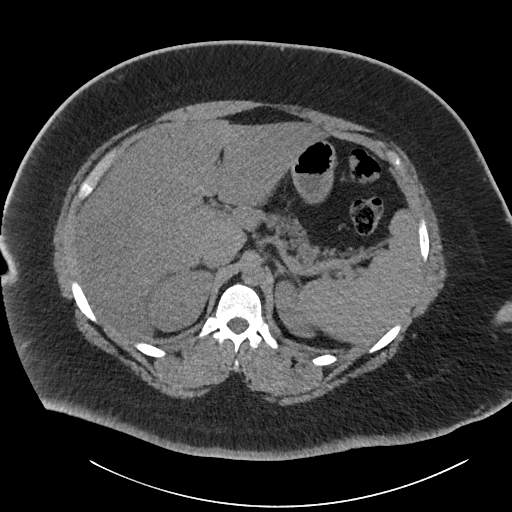
[im 95/109  soft-tissue]
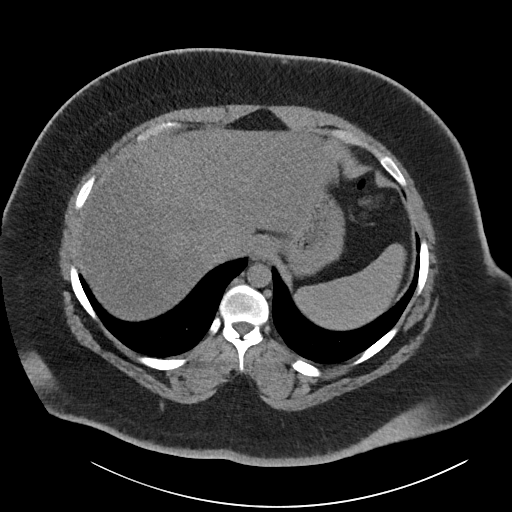
[im 104/109  soft-tissue]
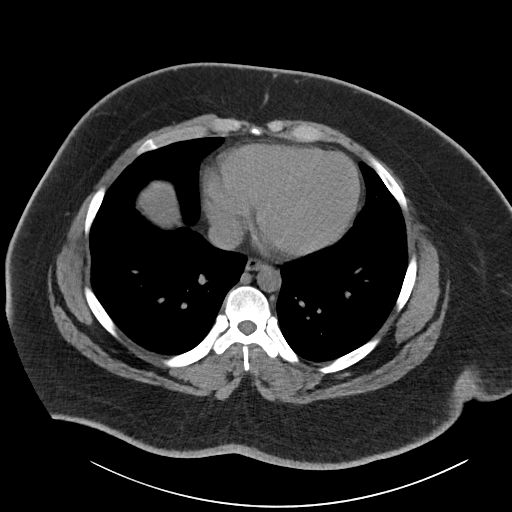

[Series 6: renal stone 2.00 br40 s3 cor · coronal · 0.90mm/px · 3 of 229 slices shown]
[im 77/229  soft-tissue]
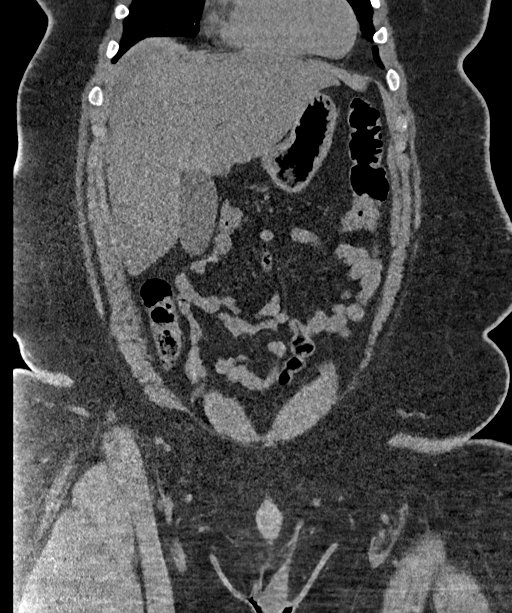
[im 102/229  soft-tissue]
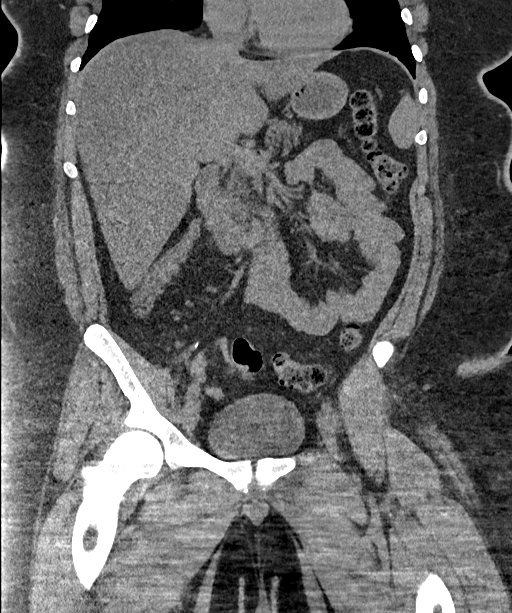
[im 127/229  soft-tissue]
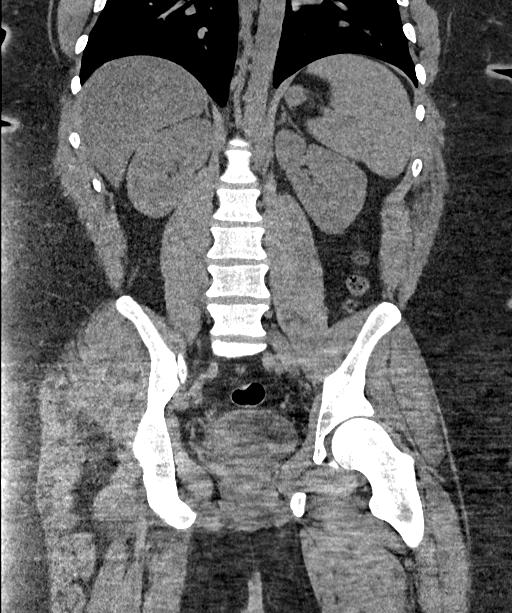

[17 of 46 positions shown; findings below may reference images not displayed]

FINDINGS: Lower chest: No acute abnormality.

Hepatobiliary: Hepatic steatosis with sparing near the gallbladder
fossa. No calcified stone or biliary dilatation

Pancreas: Unremarkable. No pancreatic ductal dilatation or
surrounding inflammatory changes.

Spleen: Normal in size without focal abnormality.

Adrenals/Urinary Tract: Adrenal glands are unremarkable. Kidneys are
normal, without renal calculi, focal lesion, or hydronephrosis.
Bladder is unremarkable.

Stomach/Bowel: Stomach is within normal limits. Status post
appendectomy. No evidence of bowel wall thickening, distention, or
inflammatory changes.

Vascular/Lymphatic: No significant vascular findings are present. No
enlarged abdominal or pelvic lymph nodes.

Reproductive: Prostate is unremarkable.

Other: No abdominal wall hernia or abnormality. No abdominopelvic
ascites.

Musculoskeletal: Degenerative changes of the spine. No acute or
suspicious osseous abnormality
IMPRESSION: Negative. No CT evidence for acute intra-abdominal or pelvic
abnormality. Hepatic steatosis

## 2019-11-02 ENCOUNTER — Other Ambulatory Visit: Payer: Self-pay

## 2019-11-25 ENCOUNTER — Encounter: Payer: Self-pay | Admitting: Internal Medicine

## 2020-01-26 ENCOUNTER — Ambulatory Visit (INDEPENDENT_AMBULATORY_CARE_PROVIDER_SITE_OTHER): Payer: Medicaid Other | Admitting: Internal Medicine

## 2020-01-26 ENCOUNTER — Other Ambulatory Visit: Payer: Self-pay

## 2020-01-26 ENCOUNTER — Encounter: Payer: Self-pay | Admitting: Internal Medicine

## 2020-01-26 DIAGNOSIS — G4733 Obstructive sleep apnea (adult) (pediatric): Secondary | ICD-10-CM

## 2020-01-26 DIAGNOSIS — Z68.41 Body mass index (BMI) pediatric, greater than or equal to 95th percentile for age: Secondary | ICD-10-CM

## 2020-01-26 NOTE — Patient Instructions (Signed)
Please put your CPAP on every night as you get in bed. It is protecting your brain and your heart- we don't want the body you have to live in to fail on you.  I do strongly agree with your paln to get your covid vaccine- that is important.  Please call us if we can help

## 2020-01-26 NOTE — Progress Notes (Signed)
10/20/19- 18 yoM never smoker for sleep evaluation Consult OSA NPSG 03/27/19  AHI 169.9/ hr, desaturation to 66%, Body weight 430 lbs, CPAP titrated to 16 CPAP auto 12-18, compliance 23%, AHI 1.9/ hr. Medical problem list includes Morbid obesity, PreDiabetes,  Epworth score 3 Body weight today 465 lbs CPAP [facility] He complains of frequent, restless movement in sleep and heavy snoring.  Uses CPAP only occasionally, without real understanding. Mask is uncomfortable. 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 No sleep meds. Up once to bathroom. Up for the day around 9:00AM.  01/26/20- 18 yoM never smoker followed for OSA, complicated by Morbid Obesity, PreDiabetes CPAP auto 12-18/ [facility] Download compliance 3%- only used 3 days, AHI 1.1/ hr Body weight today 470 lbs Sent for Mask desensitization 4/28 Says he is trying CPAP- discussed medical purpose, compliance goals, need to make up his mind and get it done. He says he falls asleep quickly, before mask on.  Pending Covax next week- I emphasized importance.  ROS-see HPI   + = positive Constitutional:    weight loss, night sweats, fevers, chills, fatigue, lassitude. HEENT:    headaches, difficulty swallowing, tooth/dental problems, sore throat,       sneezing, itching, ear ache, nasal congestion, post nasal drip, snoring CV:    chest pain, orthopnea, PND, swelling in lower extremities, anasarca,                                   dizziness, palpitations Resp:   shortness of breath with exertion or at rest.                productive cough,   non-productive cough, coughing up of blood.              change in color of mucus.  wheezing.   Skin:    rash or lesions. GI:  No-   heartburn, indigestion, abdominal pain, nausea, vomiting, diarrhea,                 change in bowel habits, loss of appetite GU: dysuria, change in color of urine, no urgency or frequency.   flank pain. MS:   joint pain, stiffness, decreased range of motion, back pain. Neuro-      nothing unusual Psych:  change in mood or affect.  depression or anxiety.   memory loss.  OBJ- Physical Exam General- Alert, Oriented, Affect-appropriate, Distress- none acute, + morbid obesity Skin- rash-none, lesions- none, excoriation- none Lymphadenopathy- none Head- atraumatic            Eyes- Gross vision intact, PERRLA, conjunctivae and secretions clear            Ears- Hearing, canals-normal            Nose- Clear, no-Septal dev, mucus, polyps, erosion, perforation             Throat- Mallampati III-IV , mucosa clear , drainage- none, tonsils+, + teeth Neck- flexible , trachea midline, no stridor , thyroid nl, carotid no bruit Chest - symmetrical excursion , unlabored           Heart/CV- RRR , no murmur , no gallop  , no rub, nl s1 s2                           - JVD- none , edema- none, stasis changes- none, varices- none  Lung- clear to P&A, wheeze- none, cough- none , dullness-none, rub- none           Chest wall-  Abd-  Br/ Gen/ Rectal- Not done, not indicated Extrem- cyanosis- none, clubbing, none, atrophy- none, strength- nl Neuro- grossly intact to observation

## 2020-01-29 NOTE — Assessment & Plan Note (Signed)
Really sad situation. This degree of obesity would seem to indicate underlying issues out of my skill set.

## 2020-01-29 NOTE — Assessment & Plan Note (Signed)
Emphasis on compliance and comfort Plan- continuing CPAP auto 12-18, stressing importance

## 2020-02-28 ENCOUNTER — Encounter (INDEPENDENT_AMBULATORY_CARE_PROVIDER_SITE_OTHER): Payer: Self-pay | Admitting: Family Medicine

## 2020-02-28 ENCOUNTER — Ambulatory Visit (INDEPENDENT_AMBULATORY_CARE_PROVIDER_SITE_OTHER): Payer: Medicaid Other | Admitting: Family Medicine

## 2020-02-28 ENCOUNTER — Other Ambulatory Visit: Payer: Self-pay

## 2020-02-28 VITALS — BP 138/86 | HR 95 | Temp 98.3°F | Ht 68.0 in | Wt >= 6400 oz

## 2020-02-28 DIAGNOSIS — R609 Edema, unspecified: Secondary | ICD-10-CM | POA: Diagnosis not present

## 2020-02-28 DIAGNOSIS — R030 Elevated blood-pressure reading, without diagnosis of hypertension: Secondary | ICD-10-CM

## 2020-02-28 DIAGNOSIS — E038 Other specified hypothyroidism: Secondary | ICD-10-CM

## 2020-02-28 DIAGNOSIS — Z1331 Encounter for screening for depression: Secondary | ICD-10-CM

## 2020-02-28 DIAGNOSIS — G4733 Obstructive sleep apnea (adult) (pediatric): Secondary | ICD-10-CM

## 2020-02-28 DIAGNOSIS — R5383 Other fatigue: Secondary | ICD-10-CM

## 2020-02-28 DIAGNOSIS — R600 Localized edema: Secondary | ICD-10-CM | POA: Insufficient documentation

## 2020-02-28 DIAGNOSIS — R0602 Shortness of breath: Secondary | ICD-10-CM | POA: Insufficient documentation

## 2020-02-28 DIAGNOSIS — R7303 Prediabetes: Secondary | ICD-10-CM

## 2020-02-28 DIAGNOSIS — Z0289 Encounter for other administrative examinations: Secondary | ICD-10-CM

## 2020-02-28 DIAGNOSIS — Z6841 Body Mass Index (BMI) 40.0 and over, adult: Secondary | ICD-10-CM

## 2020-02-28 DIAGNOSIS — Z9989 Dependence on other enabling machines and devices: Secondary | ICD-10-CM

## 2020-02-29 LAB — CBC WITH DIFFERENTIAL/PLATELET
Basophils Absolute: 0 10*3/uL (ref 0.0–0.2)
Basos: 1 %
EOS (ABSOLUTE): 0.1 10*3/uL (ref 0.0–0.4)
Eos: 2 %
Hematocrit: 42.2 % (ref 37.5–51.0)
Hemoglobin: 13.1 g/dL (ref 13.0–17.7)
Immature Grans (Abs): 0 10*3/uL (ref 0.0–0.1)
Immature Granulocytes: 1 %
Lymphocytes Absolute: 1.3 10*3/uL (ref 0.7–3.1)
Lymphs: 32 %
MCH: 25 pg — ABNORMAL LOW (ref 26.6–33.0)
MCHC: 31 g/dL — ABNORMAL LOW (ref 31.5–35.7)
MCV: 81 fL (ref 79–97)
Monocytes Absolute: 0.3 10*3/uL (ref 0.1–0.9)
Monocytes: 9 %
Neutrophils Absolute: 2.2 10*3/uL (ref 1.4–7.0)
Neutrophils: 55 %
Platelets: 322 10*3/uL (ref 150–450)
RBC: 5.24 x10E6/uL (ref 4.14–5.80)
RDW: 14.2 % (ref 11.6–15.4)
WBC: 3.9 10*3/uL (ref 3.4–10.8)

## 2020-02-29 LAB — HEMOGLOBIN A1C
Est. average glucose Bld gHb Est-mCnc: 143 mg/dL
Hgb A1c MFr Bld: 6.6 % — ABNORMAL HIGH (ref 4.8–5.6)

## 2020-02-29 LAB — COMPREHENSIVE METABOLIC PANEL
ALT: 50 IU/L — ABNORMAL HIGH (ref 0–44)
AST: 26 IU/L (ref 0–40)
Albumin/Globulin Ratio: 1.1 — ABNORMAL LOW (ref 1.2–2.2)
Albumin: 4.2 g/dL (ref 4.1–5.2)
Alkaline Phosphatase: 96 IU/L (ref 55–125)
BUN/Creatinine Ratio: 15 (ref 9–20)
BUN: 10 mg/dL (ref 6–20)
Bilirubin Total: 0.3 mg/dL (ref 0.0–1.2)
CO2: 26 mmol/L (ref 20–29)
Calcium: 9.5 mg/dL (ref 8.7–10.2)
Chloride: 101 mmol/L (ref 96–106)
Creatinine, Ser: 0.65 mg/dL — ABNORMAL LOW (ref 0.76–1.27)
GFR calc Af Amer: 163 mL/min/{1.73_m2} (ref >59)
GFR calc non Af Amer: 141 mL/min/{1.73_m2} (ref >59)
Globulin, Total: 3.8 g/dL (ref 1.5–4.5)
Glucose: 95 mg/dL (ref 65–99)
Potassium: 4.4 mmol/L (ref 3.5–5.2)
Sodium: 141 mmol/L (ref 134–144)
Total Protein: 8 g/dL (ref 6.0–8.5)

## 2020-02-29 LAB — LIPID PANEL
Chol/HDL Ratio: 3.7 ratio (ref 0.0–5.0)
Cholesterol, Total: 180 mg/dL — ABNORMAL HIGH (ref 100–169)
HDL: 49 mg/dL (ref >39)
LDL Chol Calc (NIH): 114 mg/dL — ABNORMAL HIGH (ref 0–109)
Triglycerides: 91 mg/dL — ABNORMAL HIGH (ref 0–89)
VLDL Cholesterol Cal: 17 mg/dL (ref 5–40)

## 2020-02-29 LAB — VITAMIN D 25 HYDROXY (VIT D DEFICIENCY, FRACTURES): Vit D, 25-Hydroxy: 11.1 ng/mL — ABNORMAL LOW (ref 30.0–100.0)

## 2020-02-29 LAB — T3: T3, Total: 151 ng/dL (ref 71–180)

## 2020-02-29 LAB — FOLATE: Folate: 10.6 ng/mL (ref >3.0)

## 2020-02-29 LAB — TSH: TSH: 2.69 u[IU]/mL (ref 0.450–4.500)

## 2020-02-29 LAB — INSULIN, RANDOM: INSULIN: 80.6 u[IU]/mL — ABNORMAL HIGH (ref 2.6–24.9)

## 2020-02-29 LAB — T4: T4, Total: 7.1 ug/dL (ref 4.5–12.0)

## 2020-02-29 LAB — VITAMIN B12: Vitamin B-12: 475 pg/mL (ref 232–1245)

## 2020-02-29 NOTE — Progress Notes (Signed)
Dear Lance Bosch, NP,   Thank you for referring Joellen Jersey to our clinic. The following note includes my evaluation and treatment recommendations.  Chief Complaint:   OBESITY KELSEN CELONA (MR# [id number]) is a 19 y.o. male who presents for evaluation and treatment of obesity and related comorbidities. Current BMI is Body mass index is 71.62 kg/m. Duard has been struggling with his weight for many years and has been unsuccessful in either losing weight, maintaining weight loss, or reaching his healthy weight goal.  Lula is currently in the action stage of change and ready to dedicate time achieving and maintaining a healthier weight. Hilery is interested in becoming our patient and working on intensive lifestyle modifications including (but not limited to) diet and exercise for weight loss.  [name] used [facility] for pediatric weight management in 2019, but it did not work well because no diet plan was given, and per patient, he lacked direction.  He eats once per day at fast food restaurants, usually [employer] or [employer].  He says he had labs with his PCP (he is not sure the name of the practice) around 1-2 months ago.  They are not in EPIC and I asked him to bring in a copy of his lab results and/or we need the name of his provider so my staff can request the results.    Edem's habits were reviewed today and are as follows: His family eats meals together, he thinks his family will eat healthier with him, his desired weight loss is 250 pounds, he has been heavy most of his life, he started gaining weight at 19 years old, his heaviest weight ever was 470 pounds, he is a picky eater and doesn't like to eat healthier foods, he craves fried foods, hamburgers, french fries, pastas, and hot dogs, he skips breakfast frequently, he is frequently drinking liquids with calories, he frequently makes poor food choices, he has problems with excessive hunger, he  frequently eats larger portions than normal and he struggles with emotional eating.  Depression Screen [facility]'s Food and Mood (modified PHQ-9) score was 18.  Depression screen [facility] 2/9 02/28/2020  Decreased Interest 3  Down, Depressed, Hopeless 3  PHQ - 2 Score 6  Altered sleeping 3  Tired, decreased energy 3  Change in appetite 2  Feeling bad or failure about yourself  1  Trouble concentrating 2  Moving slowly or fidgety/restless 1  Suicidal thoughts 0  PHQ-9 Score 18  Difficult doing work/chores Somewhat difficult   Subjective:   1. Other fatigue Everet denies daytime somnolence and admits to waking up still tired. Patent has a history of symptoms of morning fatigue, morning headache and snoring. Harman generally gets 6-8 hours of sleep per night, and states that he has poor quality sleep. Snoring is present. Apneic episodes are present. Epworth Sleepiness Score is 8.  2. SOB (shortness of breath) on exertion Gwendolyn notes increasing shortness of breath with exercising and seems to be worsening over time with weight gain. He notes getting out of breath sooner with activity than he used to. This has gotten worse recently. Shahzad denies shortness of breath at rest or orthopnea.  3. Peripheral edema Lorne has been on Lasix 40 mg daily along with a potassium supplement for 6 months, directed by his PCP.  4. Pre-hypertension Review: taking medications as instructed, no medication side effects noted, no chest pain on exertion.   BP Readings from Last 3 Encounters:  02/28/20 138/86  01/26/20 120/70  10/20/19 118/78   5. Prediabetes Gabriell has a diagnosis of prediabetes based on his elevated HgA1c and was informed this puts him at greater risk of developing diabetes. He continues to work on diet and exercise to decrease his risk of diabetes. He denies nausea or hypoglycemia.  He was diagnosed about a year or so ago.  He is followed by his PCP, whom he sees every 6 months or so.  Lab Results    Component Value Date   HGBA1C 6.6 (H) 02/28/2020   Lab Results  Component Value Date   INSULIN 80.6 (H) 02/28/2020   6. Other specified hypothyroidism He is taking Synthroid 25 mcg twice daily.  Lab Results  Component Value Date   TSH 2.690 02/28/2020   7. OSA on CPAP Markale has a diagnosis of sleep apnea. He reports that he is using a CPAP regularly.  Treatment plan per specialist.  8. Depression screening Ryker was screened for depression as part of his new patient workup.  PHQ-9 is 18.  Assessment/Plan:   1. Other fatigue Shant does feel that his weight is causing his energy to be lower than it should be. Fatigue may be related to obesity, depression or many other causes. Labs will be ordered, and in the meanwhile, Tyresse will focus on self care including making healthy food choices, increasing physical activity and focusing on stress reduction.  - EKG 12-Lead - Vitamin B12 - CBC with Differential/Platelet - Comprehensive metabolic panel - Folate - Lipid panel - VITAMIN D 25 Hydroxy (Vit-D Deficiency, Fractures)  2. SOB (shortness of breath) on exertion Malikhi does feel that he gets out of breath more easily that he used to when he exercises. Kyree's shortness of breath appears to be obesity related and exercise induced. He has agreed to work on weight loss and gradually increase exercise to treat his exercise induced shortness of breath. Will continue to monitor closely.  3. Peripheral edema Prudent nutritional plan, weight loss, low salt, eventually will increase activity.  - Vitamin B12 - CBC with Differential/Platelet - Comprehensive metabolic panel - Folate - Hemoglobin A1c - Insulin, random - Lipid panel - VITAMIN D 25 Hydroxy (Vit-D Deficiency, Fractures)  4. Pre-hypertension Educated him on this new diagnosis.  He says he was never told this prior.  Prudent nutritional plan, weight loss, low salt diet.  5. Prediabetes Rooney will continue to work on weight loss,  exercise, and decreasing simple carbohydrates to help decrease the risk of diabetes.  Check labs, weight loss via prudent nutritional plan.  - Hemoglobin A1c - Insulin, random   6. Other specified hypothyroidism Patient with hypothyroidism, on levothyroxine therapy. He appears euthyroid. Orders and follow up as documented in patient record.  Will check labs, weight loss via prudent nutritional plan.  Counseling . Good thyroid control is important for overall health. Supratherapeutic thyroid levels are dangerous and will not improve weight loss results. . The correct way to take levothyroxine is fasting, with water, separated by at least 30 minutes from breakfast, and separated by more than 4 hours from calcium, iron, multivitamins, acid reflux medications (PPIs).   - T3 - T4 - TSH  7. OSA on CPAP Intensive lifestyle modifications are the first line treatment for this issue. We discussed several lifestyle modifications today and he will continue to work on diet, exercise and weight loss efforts. We will continue to monitor. Orders and follow up as documented in patient record.  Discussed with him how  important compliance with the mask is.  Counseling  Sleep apnea is a condition in which breathing pauses or becomes shallow during sleep. This happens over and over during the night. This disrupts your sleep and keeps your body from getting the rest that it needs, which can cause tiredness and lack of energy (fatigue) during the day.  Sleep apnea treatment: If you were given a device to open your airway while you sleep, USE IT!  Sleep hygiene:   Limit or avoid alcohol, caffeinated beverages, and cigarettes, especially close to bedtime.   Do not eat a large meal or eat spicy foods right before bedtime. This can lead to digestive discomfort that can make it hard for you to sleep.  Keep a sleep diary to help you and your health care provider figure out what could be causing your insomnia.   . Make your bedroom a dark, comfortable place where it is easy to fall asleep. ? Put up shades or blackout curtains to block light from outside. ? Use a white noise machine to block noise. ? Keep the temperature cool. . Limit screen use before bedtime. This includes: ? Watching TV. ? Using your smartphone, tablet, or computer. . Stick to a routine that includes going to bed and waking up at the same times every day and night. This can help you fall asleep faster. Consider making a quiet activity, such as reading, part of your nighttime routine. . Try to avoid taking naps during the day so that you sleep better at night. . Get out of bed if you are still awake after 15 minutes of trying to sleep. Keep the lights down, but try reading or doing a quiet activity. When you feel sleepy, go back to bed.  8. Depression screening Shaine had a positive depression screening. Depression is commonly associated with obesity and often results in emotional eating behaviors. We will monitor this closely and work on CBT to help improve the non-hunger eating patterns. Referral to Psychology may be required if no improvement is seen as he continues in our clinic.  9. Class 3 severe obesity with serious comorbidity and body mass index (BMI) greater than or equal to 70 in adult, unspecified obesity type (HCC) Clayburn Pertvan is currently in the action stage of change and his goal is to continue with weight loss efforts. I recommend Clayburn Pertvan begin the structured treatment plan as follows:  He has agreed to the Category 4 Plan plus 300 calories of 10:1 ratio foods.  Exercise goals: As is.   Behavioral modification strategies: increasing lean protein intake, increasing water intake, decreasing liquid calories, decreasing eating out, no skipping meals and decreasing junk food.  He was informed of the importance of frequent follow-up visits to maximize his success with intensive lifestyle modifications for his multiple health  conditions. He was informed we would discuss his lab results at his next visit unless there is a critical issue that needs to be addressed sooner. Shaquil agreed to keep his next visit at the agreed upon time to discuss these results.  Objective:   Blood pressure 138/86, pulse 95, temperature 98.3 F (36.8 C), height 5\' 8"  (1.727 m), weight (!) 471 lb (213.6 kg), SpO2 95 %. Body mass index is 71.62 kg/m.  EKG: Normal sinus rhythm, rate 93 bpm.  Indirect Calorimeter completed today shows a VO2 of 530 and a REE of 3688.  General: Cooperative, alert, well developed, in no acute distress. HEENT: Conjunctivae and lids unremarkable. Cardiovascular: Regular rhythm.  Lungs:  Normal work of breathing. Neurologic: No focal deficits.     Attestation Statements:   Reviewed by clinician on day of visit: allergies, medications, problem list, medical history, surgical history, family history, social history, and previous encounter notes.  I, Insurance claims handler, CMA, am acting as Energy manager for [employer], DO.  I have reviewed the above documentation for accuracy and completeness, and I agree with the above. Thomasene Lot, DO

## 2020-03-13 ENCOUNTER — Ambulatory Visit (INDEPENDENT_AMBULATORY_CARE_PROVIDER_SITE_OTHER): Payer: Self-pay | Admitting: Family Medicine

## 2020-03-14 ENCOUNTER — Encounter (INDEPENDENT_AMBULATORY_CARE_PROVIDER_SITE_OTHER): Payer: Self-pay | Admitting: Family Medicine

## 2020-03-14 ENCOUNTER — Other Ambulatory Visit: Payer: Self-pay

## 2020-03-14 ENCOUNTER — Ambulatory Visit (INDEPENDENT_AMBULATORY_CARE_PROVIDER_SITE_OTHER): Payer: Medicaid Other | Admitting: Family Medicine

## 2020-03-14 VITALS — BP 116/67 | HR 90 | Temp 98.2°F | Ht 68.0 in | Wt >= 6400 oz

## 2020-03-14 DIAGNOSIS — E559 Vitamin D deficiency, unspecified: Secondary | ICD-10-CM

## 2020-03-14 DIAGNOSIS — E1169 Type 2 diabetes mellitus with other specified complication: Secondary | ICD-10-CM

## 2020-03-14 DIAGNOSIS — K760 Fatty (change of) liver, not elsewhere classified: Secondary | ICD-10-CM | POA: Diagnosis not present

## 2020-03-14 DIAGNOSIS — E7849 Other hyperlipidemia: Secondary | ICD-10-CM | POA: Diagnosis not present

## 2020-03-14 DIAGNOSIS — Z6841 Body Mass Index (BMI) 40.0 and over, adult: Secondary | ICD-10-CM

## 2020-03-14 MED ORDER — METFORMIN HCL 1000 MG PO TABS: 1000.0000 mg | ORAL_TABLET | Freq: Two times a day (BID) | ORAL | 0 refills | Status: DC

## 2020-03-14 MED ORDER — VITAMIN D (ERGOCALCIFEROL) 1.25 MG (50000 UNIT) PO CAPS: 50000.0000 [IU] | ORAL_CAPSULE | ORAL | 0 refills | Status: DC

## 2020-03-15 NOTE — Progress Notes (Signed)
Chief Complaint:   OBESITY James Gross is here to discuss his progress with his obesity treatment plan along with follow-up of his obesity related diagnoses. James Gross is on the Category 4 Plan and states he is following his eating plan approximately 80+% of the time. James Gross states he is walking for 30 minutes 3 times per week.  Today's visit was #: 2 Starting weight: 471 lbs Starting date: 02/28/2020 Today's weight: 460 lbs Today's date: 03/14/2020 Total lbs lost to date: 11 lbs Total lbs lost since last in-office visit: 11 lbs  Interim History: James Gross says he is using MFP to chart.  Today is his first follow-up visit, and he is here to go over his labs.  Overall, he says he is doing great.  He is not drinking soda anymore and has increased his water intake.  Denies concerns with diet.  Says he likes it.  Hunger and cravings are controlled.  Subjective:   1. Other hyperlipidemia Burnham has hyperlipidemia and has been trying to improve his cholesterol levels with intensive lifestyle modification including a low saturated fat diet, exercise and weight loss. He denies any chest pain, claudication or myalgias.    Lab Results  Component Value Date   ALT 50 (H) 02/28/2020   AST 26 02/28/2020   ALKPHOS 96 02/28/2020   BILITOT 0.3 02/28/2020   Lab Results  Component Value Date   CHOL 180 (H) 02/28/2020   HDL 49 02/28/2020   LDLCALC 114 (H) 02/28/2020   TRIG 91 (H) 02/28/2020   CHOLHDL 3.7 02/28/2020   2. NAFLD (nonalcoholic fatty liver disease) James Gross has a diagnosis of elevated ALT. His BMI is over 40. He denies abdominal pain or jaundice and has never been told of any liver problems in the past. He denies excessive alcohol intake.  He was told in the past by a surgeon that he has this.  Lab Results  Component Value Date   ALT 50 (H) 02/28/2020   AST 26 02/28/2020   ALKPHOS 96 02/28/2020   BILITOT 0.3 02/28/2020   3. Pre-Diabetes Medications reviewed.  Has been on metformin for pre-diabetes.   Diabetic ROS: no polyuria or polydipsia, no chest pain, dyspnea or TIA's, no numbness, tingling or pain in extremities.  A1c recently was 6.6.   (has history of prediabetes for 7-9 years).  This is the first time he has been told he is diabetic.   Lab Results  Component Value Date   HGBA1C 6.6 (H) 02/28/2020   HGBA1C 5.9 07/27/2012   Lab Results  Component Value Date   LDLCALC 114 (H) 02/28/2020   CREATININE 0.65 (L) 02/28/2020   Lab Results  Component Value Date   INSULIN 80.6 (H) 02/28/2020   4. Vitamin D deficiency James Gross's Vitamin D level was 11.1 on 02/28/2020. He is currently taking no vitamin D supplement. He denies nausea, vomiting or muscle weakness.  Assessment/Plan:   1. Other hyperlipidemia New.  Discussed labs with patient today.  Cardiovascular risk and specific lipid/LDL goals reviewed.  We discussed several lifestyle modifications today and Jonothan will continue to work on diet, exercise and weight loss efforts. Orders and follow up as documented in patient record.   Counseling Intensive lifestyle modifications are the first line treatment for this issue. . Dietary changes: Increase soluble fiber. Decrease simple carbohydrates. . Exercise changes: Moderate to vigorous-intensity aerobic activity 150 minutes per week if tolerated. . Lipid-lowering medications: see documented in medical record.  2. NAFLD (nonalcoholic fatty liver disease) Discussed labs  with patient today.  Miking was educated the importance of weight loss. James Gross agreed to continue with his weight loss efforts with healthier diet and exercise as an essential part of his treatment plan.  3. Type 2 diabetes mellitus with other specified complication, without long-term current use of insulin (HCC) [place].   Discussed labs with patient today.  Good blood sugar control is important to decrease the likelihood of diabetic complications such as nephropathy, neuropathy, limb loss, blindness, coronary artery disease,  and death. Intensive lifestyle modification including diet, exercise and weight loss are the first line of treatment for diabetes.  Will increase metformin to 1000 mg twice daily.  -Increase metFORMIN (GLUCOPHAGE) 1000 MG tablet; Take 1 tablet (1,000 mg total) by mouth 2 (two) times daily with a meal.  Dispense: 60 tablet; Refill: 0  4. Vitamin D deficiency New.  Discussed labs with patient today.  Low Vitamin D level contributes to fatigue and are associated with obesity, breast, and colon cancer. He agrees to start to take prescription Vitamin D @50 ,000 IU every week and will follow-up for routine testing of Vitamin D, at least 2-3 times per year to avoid over-replacement.  -Start Vitamin D, Ergocalciferol, (DRISDOL) 1.25 MG (50000 UNIT) CAPS capsule; Take 1 capsule (50,000 Units total) by mouth every 7 (seven) days.  Dispense: 4 capsule; Refill: 0  5. Class 3 severe obesity with serious comorbidity and body mass index (BMI) greater than or equal to 70 in adult, unspecified obesity type (HCC) James Gross is currently in the action stage of change. As such, his goal is to continue with weight loss efforts. He has agreed to the Category 4 Plan, but he likes to track on MFP to keep himself accountable.  He does not want to change the meal plan!   Exercise goals: All adults should avoid inactivity. Some physical activity is better than none, and adults who participate in any amount of physical activity gain some health benefits.  Behavioral modification strategies: increasing lean protein intake, decreasing simple carbohydrates, increasing water intake, no skipping meals, meal planning and cooking strategies and planning for success.  Page has agreed to follow-up with our clinic in 2 weeks. He was informed of the importance of frequent follow-up visits to maximize his success with intensive lifestyle modifications for his multiple health conditions.   Objective:   Blood pressure 116/67, pulse 90,  temperature 98.2 F (36.8 C), height 5\' 8"  (1.727 m), weight (!) 460 lb (208.7 kg), SpO2 98 %. Body mass index is 69.94 kg/m.  General: Cooperative, alert, well developed, in no acute distress. HEENT: Conjunctivae and lids unremarkable. Cardiovascular: Regular rhythm.  Lungs: Normal work of breathing. Neurologic: No focal deficits.   Lab Results  Component Value Date   CREATININE 0.65 (L) 02/28/2020   BUN 10 02/28/2020   NA 141 02/28/2020   K 4.4 02/28/2020   CL 101 02/28/2020   CO2 26 02/28/2020   Lab Results  Component Value Date   ALT 50 (H) 02/28/2020   AST 26 02/28/2020   ALKPHOS 96 02/28/2020   BILITOT 0.3 02/28/2020   Lab Results  Component Value Date   HGBA1C 6.6 (H) 02/28/2020   HGBA1C 5.9 07/27/2012   Lab Results  Component Value Date   INSULIN 80.6 (H) 02/28/2020   Lab Results  Component Value Date   TSH 2.690 02/28/2020   Lab Results  Component Value Date   CHOL 180 (H) 02/28/2020   HDL 49 02/28/2020   LDLCALC 114 (H) 02/28/2020  TRIG 91 (H) 02/28/2020   CHOLHDL 3.7 02/28/2020   Lab Results  Component Value Date   WBC 3.9 02/28/2020   HGB 13.1 02/28/2020   HCT 42.2 02/28/2020   MCV 81 02/28/2020   PLT 322 02/28/2020   Attestation Statements:   Reviewed by clinician on day of visit: allergies, medications, problem list, medical history, surgical history, family history, social history, and previous encounter notes.  Time spent on visit including pre-visit chart review and post-visit care and charting was 60 minutes.   I, Insurance claims handler, CMA, am acting as Energy manager for [employer], DO.  I have reviewed the above documentation for accuracy and completeness, and I agree with the above. Thomasene Lot, DO

## 2020-03-28 ENCOUNTER — Other Ambulatory Visit: Payer: Self-pay

## 2020-03-28 ENCOUNTER — Ambulatory Visit (INDEPENDENT_AMBULATORY_CARE_PROVIDER_SITE_OTHER): Payer: Medicaid Other | Admitting: Family Medicine

## 2020-03-28 ENCOUNTER — Encounter (INDEPENDENT_AMBULATORY_CARE_PROVIDER_SITE_OTHER): Payer: Self-pay | Admitting: Family Medicine

## 2020-03-28 VITALS — BP 110/68 | HR 79 | Temp 98.5°F | Ht 68.0 in | Wt >= 6400 oz

## 2020-03-28 DIAGNOSIS — E1169 Type 2 diabetes mellitus with other specified complication: Secondary | ICD-10-CM | POA: Diagnosis not present

## 2020-03-28 DIAGNOSIS — E559 Vitamin D deficiency, unspecified: Secondary | ICD-10-CM

## 2020-03-28 DIAGNOSIS — R030 Elevated blood-pressure reading, without diagnosis of hypertension: Secondary | ICD-10-CM

## 2020-03-28 DIAGNOSIS — Z6841 Body Mass Index (BMI) 40.0 and over, adult: Secondary | ICD-10-CM

## 2020-04-01 NOTE — Progress Notes (Signed)
Chief Complaint:   OBESITY James Gross is here to discuss his progress with his obesity treatment plan along with follow-up of his obesity related diagnoses. James Gross is on the Category 4 Plan and states he is following his eating plan approximately 80% of the time. James Gross states he is walking for 30 minutes 3 times per week.  Today's visit was #: 3 Starting weight: 471 lbs Starting date: 02/28/2020 Today's weight: 449 lbs Today's date: 03/28/2020 Total lbs lost to date: 22 lbs Total lbs lost since last in-office visit: 11 lbs  Interim History:   James Gross says the plan was easier to follow.  His hunger is controlled.  He has cravings for fast foods and drinks water and waits for them to pass (has not given in yet/getting easier).    He is feeling pretty good with no concerns with the plan.  His mom is very helpful.    He eats 8-10 ounces of protein at night.  Does a lot of meal planning with Mom as well.    For snacks, he has 100-calorie nutrition bars or 100-calorie snack packs of Skinny Pop.    Subjective:   1. Type 2 diabetes mellitus with other specified complication, without long-term current use of insulin (HCC) Medications reviewed. Diabetic ROS: no polyuria or polydipsia, no chest pain, dyspnea or TIA's, no numbness, tingling or pain in extremities.  Started metformin 1000 mg twice daily at last office visit (increased from 500 mg twice daily).  He has been on it for 4-5 days now.  Tolerating well.  He says that at first he was sick to his stomach when taking it, but has no issues now.  Lab Results  Component Value Date   HGBA1C 6.6 (H) 02/28/2020   HGBA1C 5.9 07/27/2012   Lab Results  Component Value Date   LDLCALC 114 (H) 02/28/2020   CREATININE 0.65 (L) 02/28/2020   Lab Results  Component Value Date   INSULIN 80.6 (H) 02/28/2020   2. Elevated blood pressure reading Review: taking medications as instructed, no medication side effects noted, no chest pain on exertion, no  dyspnea on exertion, no swelling of ankles.  Now resolved with improved diet and decreased salt along with weight loss.  BP Readings from Last 3 Encounters:  03/28/20 110/68  03/14/20 116/67  02/28/20 138/86   3. Vitamin D deficiency James Gross's Vitamin D level was 11.1 on 02/28/2020. He is currently taking prescription vitamin D 50,000 IU each week. He denies nausea, vomiting or muscle weakness.  No issues or concerns.  He has noticed higher energy levels over the past 2 weeks.  Assessment/Plan:   1. Type 2 diabetes mellitus with other specified complication, without long-term current use of insulin (HCC) Good blood sugar control is important to decrease the likelihood of diabetic complications such as nephropathy, neuropathy, limb loss, blindness, coronary artery disease, and death. Intensive lifestyle modification including diet, exercise and weight loss are the first line of treatment for diabetes.  Continue medications at current dose- Metformin 1000 bid.  Recheck A1c every 3 months. Denies need for RF   2. Elevated blood pressure reading James Gross is working on healthy weight loss and exercise to improve blood pressure control. We will watch for signs of hypotension as he continues his lifestyle modifications.  Will continue to lose weight and will continue to monitor.  3. Vitamin D deficiency Low Vitamin D level contributes to fatigue and are associated with obesity, breast, and colon cancer. He agrees to continue  to take prescription Vitamin D @50 ,000 IU every week and will follow-up for routine testing of Vitamin D, at least 2-3 times per year to avoid over-replacement.  Recheck level in 3 months or so.  Denies need for RF  4. Class 3 severe obesity with serious comorbidity and body mass index (BMI) of 60.0 to 69.9 in adult, unspecified obesity type (HCC) James Gross is currently in the action stage of change. As such, his goal is to continue with weight loss efforts. He has agreed to the Category 4  Plan.   Exercise goals: Increase to 30 minutes 5 days per week if possible.  Behavioral modification strategies: increasing lean protein intake, increasing water intake, meal planning and cooking strategies and planning for success.  James Gross has agreed to follow-up with our clinic in 2 weeks. He was informed of the importance of frequent follow-up visits to maximize his success with intensive lifestyle modifications for his multiple health conditions.   Objective:   Blood pressure 110/68, pulse 79, temperature 98.5 F (36.9 C), height 5\' 8"  (1.727 m), weight (!) 449 lb (203.7 kg), SpO2 98 %. Body mass index is 68.27 kg/m.  General: Cooperative, alert, well developed, in no acute distress. HEENT: Conjunctivae and lids unremarkable. Cardiovascular: Regular rhythm.  Lungs: Normal work of breathing. Neurologic: No focal deficits.   Lab Results  Component Value Date   CREATININE 0.65 (L) 02/28/2020   BUN 10 02/28/2020   NA 141 02/28/2020   K 4.4 02/28/2020   CL 101 02/28/2020   CO2 26 02/28/2020   Lab Results  Component Value Date   ALT 50 (H) 02/28/2020   AST 26 02/28/2020   ALKPHOS 96 02/28/2020   BILITOT 0.3 02/28/2020   Lab Results  Component Value Date   HGBA1C 6.6 (H) 02/28/2020   HGBA1C 5.9 07/27/2012   Lab Results  Component Value Date   INSULIN 80.6 (H) 02/28/2020   Lab Results  Component Value Date   TSH 2.690 02/28/2020   Lab Results  Component Value Date   CHOL 180 (H) 02/28/2020   HDL 49 02/28/2020   LDLCALC 114 (H) 02/28/2020   TRIG 91 (H) 02/28/2020   CHOLHDL 3.7 02/28/2020   Lab Results  Component Value Date   WBC 3.9 02/28/2020   HGB 13.1 02/28/2020   HCT 42.2 02/28/2020   MCV 81 02/28/2020   PLT 322 02/28/2020   Attestation Statements:   Reviewed by clinician on day of visit: allergies, medications, problem list, medical history, surgical history, family history, social history, and previous encounter notes.  Patient was in the office  today for approximately 30 minutes.  Over 50% of the time I spent with patient in face to face counseling of various medical conditions, treatment plans of those medical conditions including medicine management and lifestyle modification, strategies to improve health and well being; and in coordination of care.  SEE TREATMENT PLAN FOR DETAILS.  I, 03/01/2020, CMA, am acting as 03/01/2020 for Insurance claims handler, DO.  I have reviewed the above documentation for accuracy and completeness, and I agree with the above. Energy manager, DO

## 2020-04-16 ENCOUNTER — Ambulatory Visit (INDEPENDENT_AMBULATORY_CARE_PROVIDER_SITE_OTHER): Payer: Medicaid Other | Admitting: Family Medicine

## 2020-04-16 ENCOUNTER — Other Ambulatory Visit: Payer: Self-pay

## 2020-04-16 ENCOUNTER — Encounter (INDEPENDENT_AMBULATORY_CARE_PROVIDER_SITE_OTHER): Payer: Self-pay | Admitting: Family Medicine

## 2020-04-16 VITALS — BP 119/63 | HR 81 | Temp 98.1°F | Ht 68.0 in | Wt >= 6400 oz

## 2020-04-16 DIAGNOSIS — E559 Vitamin D deficiency, unspecified: Secondary | ICD-10-CM | POA: Diagnosis not present

## 2020-04-16 DIAGNOSIS — E1169 Type 2 diabetes mellitus with other specified complication: Secondary | ICD-10-CM

## 2020-04-16 DIAGNOSIS — Z6841 Body Mass Index (BMI) 40.0 and over, adult: Secondary | ICD-10-CM | POA: Diagnosis not present

## 2020-04-17 NOTE — Progress Notes (Signed)
Chief Complaint:   OBESITY James Gross is here to discuss his progress with his obesity treatment plan along with follow-up of his obesity related diagnoses. James Gross is on the Category 4 Plan and states he is following his eating plan approximately 80+% of the time. James Gross states he is walking for 30 minutes 3 times per week.  Today's visit was #: 4 Starting weight: 471 lbs Starting date: 02/28/2020 Today's weight: 449 lbs Today's date: 04/16/2020 Total lbs lost to date: 22 lbs Total lbs lost since last in-office visit: 0  Interim History: James Gross says that his hunger and cravings are well controlled. He is not skipping meals.  He says he is getting 8-10 ounces of protein at dinner and 6 ounces at lunch.  He loves chicken and [place] with mustard.  He says he got outside 3 days and walked for 30 minutes.  For snacks, he has 100 calorie snack packs and yogurt.  He is drinking water with sugar-free flavor packs.  Assessment/Plan:   1. Type 2 diabetes mellitus with other specified complication, without long-term current use of insulin (HCC) Diabetes Mellitus: needs improvement. Medication: metformin 1000 mg twice daily. Issues reviewed with him: blood sugar goals, complications of diabetes mellitus, hypoglycemia prevention and treatment, exercise, nutrition, and carbohydrate counting.   He is tolerating metformin well.  Plan:  Continue metformin 1000 mg twice daily.  Continue prudent nutritional plan, weight loss.  Lab Results  Component Value Date   HGBA1C 6.6 (H) 02/28/2020   HGBA1C 5.9 07/27/2012   Lab Results  Component Value Date   LDLCALC 114 (H) 02/28/2020   CREATININE 0.65 (L) 02/28/2020   2. Vitamin D deficiency James Gross has a history of Vitamin D deficiency with resultant generalized fatigue as his primary symptom.  he is taking vitamin D 50,000 IU weekly for this deficiency and tolerating it well without side-effect.   Most recent Vitamin D lab reviewed-  level: 11.1  Plan:     - Discussed importance of vitamin D (as well as calcium) to their health and well-being.   - We reviewed possible symptoms of low Vitamin D including low energy, depressed mood, muscle aches, joint aches, osteoporosis etc.  - We discussed that low Vitamin D levels may be linked to an increased risk of cardiovascular events and even increased risk of cancers- such as colon and breast.   - Educated pt that weight loss will likely improve availability of vitamin D, thus encouraged Hawley to continue with meal plan and their weight loss efforts to further improve this condition  - I recommend pt take a weekly prescription vit D- see script below- which pt agrees to after discussion of risks and benefits of this medication.      - Informed patient this may be a lifelong thing, and he was encouraged to continue to take the medicine until pt told otherwise.   We will need to monitor levels regularly ( q 3-4 mo on average )  to keep levels within normal limits.   - All pt's questions and concerns regarding this condition addressed  3. Class 3 severe obesity with serious comorbidity and body mass index (BMI) of 60.0 to 69.9 in adult, unspecified obesity type (HCC)  James Gross is currently in the action stage of change. As such, his goal is to continue with weight loss efforts. He has agreed to the Category 4 Plan +400 calories per day with goal calories of 2200-2400 calories per day.  He likes to  use MFP.[name]   Exercise goals: Increase activity to 5 days per week.  Behavioral modification strategies: increasing lean protein intake, decreasing simple carbohydrates, increasing water intake, decreasing sodium intake, increasing high fiber foods, meal planning and cooking strategies, better snacking choices and planning for success.  Edmond has agreed to follow-up with our clinic in 2 weeks. He was informed of the importance of frequent follow-up visits to maximize his success with intensive lifestyle modifications  for his multiple health conditions.   Objective:   Blood pressure 119/63, pulse 81, temperature 98.1 F (36.7 C), height 5\' 8"  (1.727 m), weight (!) 449 lb 11.8 oz (204 kg), SpO2 97 %. Body mass index is 68.38 kg/m.  General: Cooperative, alert, well developed, in no acute distress. HEENT: Conjunctivae and lids unremarkable. Cardiovascular: Regular rhythm.  Lungs: Normal work of breathing. Neurologic: No focal deficits.   Lab Results  Component Value Date   CREATININE 0.65 (L) 02/28/2020   BUN 10 02/28/2020   NA 141 02/28/2020   K 4.4 02/28/2020   CL 101 02/28/2020   CO2 26 02/28/2020   Lab Results  Component Value Date   ALT 50 (H) 02/28/2020   AST 26 02/28/2020   ALKPHOS 96 02/28/2020   BILITOT 0.3 02/28/2020   Lab Results  Component Value Date   HGBA1C 6.6 (H) 02/28/2020   HGBA1C 5.9 07/27/2012   Lab Results  Component Value Date   INSULIN 80.6 (H) 02/28/2020   Lab Results  Component Value Date   TSH 2.690 02/28/2020   Lab Results  Component Value Date   CHOL 180 (H) 02/28/2020   HDL 49 02/28/2020   LDLCALC 114 (H) 02/28/2020   TRIG 91 (H) 02/28/2020   CHOLHDL 3.7 02/28/2020   Lab Results  Component Value Date   WBC 3.9 02/28/2020   HGB 13.1 02/28/2020   HCT 42.2 02/28/2020   MCV 81 02/28/2020   PLT 322 02/28/2020   Attestation Statements:   Reviewed by clinician on day of visit: allergies, medications, problem list, medical history, surgical history, family history, social history, and previous encounter notes.  I, 03/01/2020, CMA, am acting as Insurance claims handler for Energy manager, DO.  I have reviewed the above documentation for accuracy and completeness, and I agree with the above. [employer], D.O.  The 21st Century Cures Act was signed into law in 2016 which includes the topic of electronic health records.  This provides immediate access to information in MyChart.  This includes consultation notes, operative notes, office notes,  lab results and pathology reports.  If you have any questions about what you read please let 2017 know at your next visit so we can discuss your concerns and take corrective action if need be.  We are right here with you.

## 2020-04-24 MED ORDER — VITAMIN D (ERGOCALCIFEROL) 1.25 MG (50000 UNIT) PO CAPS: 50000.0000 [IU] | ORAL_CAPSULE | ORAL | 0 refills | Status: DC

## 2020-04-29 ENCOUNTER — Encounter: Payer: Self-pay | Admitting: Internal Medicine

## 2020-04-30 ENCOUNTER — Ambulatory Visit (INDEPENDENT_AMBULATORY_CARE_PROVIDER_SITE_OTHER): Payer: Medicaid Other | Admitting: Family Medicine

## 2020-04-30 ENCOUNTER — Encounter (INDEPENDENT_AMBULATORY_CARE_PROVIDER_SITE_OTHER): Payer: Self-pay | Admitting: Family Medicine

## 2020-04-30 ENCOUNTER — Other Ambulatory Visit: Payer: Self-pay

## 2020-04-30 VITALS — BP 102/67 | HR 77 | Temp 98.4°F | Ht 68.0 in | Wt >= 6400 oz

## 2020-04-30 DIAGNOSIS — Z6841 Body Mass Index (BMI) 40.0 and over, adult: Secondary | ICD-10-CM

## 2020-04-30 DIAGNOSIS — E559 Vitamin D deficiency, unspecified: Secondary | ICD-10-CM | POA: Diagnosis not present

## 2020-04-30 MED ORDER — VITAMIN D (ERGOCALCIFEROL) 1.25 MG (50000 UNIT) PO CAPS: 50000.0000 [IU] | ORAL_CAPSULE | ORAL | 0 refills | Status: DC

## 2020-05-01 NOTE — Progress Notes (Signed)
HPI M never smoker followed for OSA, complicated by Morbid Obesity, PreDiabetes NPSG 03/27/19  AHI 169.9/ hr, desaturation to 66%, Body weight 430 lbs, CPAP titrated to 16  =====================================================================   01/26/20- 18 yoM never smoker followed for OSA, complicated by Morbid Obesity, PreDiabetes CPAP auto 12-18/ [facility] Download compliance 3%- only used 3 days, AHI 1.1/ hr Body weight today 470 lbs Sent for Mask desensitization 4/28 Says he is trying CPAP- discussed medical purpose, compliance goals, need to make up his mind and get it done. He says he falls asleep quickly, before mask on.  Pending Covax next week- I emphasized importance.  05/02/20- 18 yoM never smoker followed for OSA, complicated by Morbid Obesity, DM2,  CPAP auto 12-18/ [employer] Download- compliance 27%, AHI 0.6/ hr     Short nights and missed nights Body weight today- 442 lbs Covid vax- none Flu vax- today standard Working with [facility] on weight loss program. Has lost almost 30 lbs since last visit. He is trying to improve compliance and denies CPAP discomfort other than dry mouth. Discussed Biotene and humidifier adjustment. I gave him a copy of his compliance download and went over it.  ROS-see HPI   + = positive Constitutional:    +diet/weight loss, night sweats, fevers, chills, fatigue, lassitude. HEENT:    headaches, difficulty swallowing, tooth/dental problems, sore throat,       sneezing, itching, ear ache, nasal congestion, post nasal drip, snoring CV:    chest pain, orthopnea, PND, swelling in lower extremities, anasarca,                                   dizziness, palpitations Resp:   shortness of breath with exertion or at rest.                productive cough,   non-productive cough, coughing up of blood.              change in color of mucus.  wheezing.   Skin:    rash or lesions. GI:  No-   heartburn, indigestion, abdominal pain, nausea,  vomiting, diarrhea,                 change in bowel habits, loss of appetite GU: dysuria, change in color of urine, no urgency or frequency.   flank pain. MS:   joint pain, stiffness, decreased range of motion, back pain. Neuro-     nothing unusual Psych:  change in mood or affect.  depression or anxiety.   memory loss.  OBJ- Physical Exam General- Alert, Oriented, Affect-appropriate, Distress- none acute, + morbid obesity Skin- rash-none, lesions- none, excoriation- none Lymphadenopathy- none Head- atraumatic            Eyes- Gross vision intact, PERRLA, conjunctivae and secretions clear            Ears- Hearing, canals-normal            Nose- Clear, no-Septal dev, mucus, polyps, erosion, perforation             Throat- Mallampati III-IV , mucosa clear , drainage- none, tonsils+, + teeth Neck- flexible , trachea midline, no stridor , thyroid nl, carotid no bruit Chest - symmetrical excursion , unlabored           Heart/CV- RRR , no murmur , no gallop  , no rub, nl s1 s2                           -  JVD- none , edema- none, stasis changes- none, varices- none           Lung- clear to P&A, wheeze- none, cough- none , dullness-none, rub- none           Chest wall-  Abd-  Br/ Gen/ Rectal- Not done, not indicated Extrem- cyanosis- none, clubbing, none, atrophy- none, strength- nl Neuro- grossly intact to observation

## 2020-05-02 ENCOUNTER — Other Ambulatory Visit: Payer: Self-pay

## 2020-05-02 ENCOUNTER — Ambulatory Visit (INDEPENDENT_AMBULATORY_CARE_PROVIDER_SITE_OTHER): Payer: Medicaid Other | Admitting: Internal Medicine

## 2020-05-02 ENCOUNTER — Encounter: Payer: Self-pay | Admitting: Internal Medicine

## 2020-05-02 DIAGNOSIS — Z6841 Body Mass Index (BMI) 40.0 and over, adult: Secondary | ICD-10-CM

## 2020-05-02 DIAGNOSIS — Z23 Encounter for immunization: Secondary | ICD-10-CM | POA: Diagnosis not present

## 2020-05-02 DIAGNOSIS — G4733 Obstructive sleep apnea (adult) (pediatric): Secondary | ICD-10-CM | POA: Diagnosis not present

## 2020-05-02 NOTE — Assessment & Plan Note (Signed)
He is making progress working with [facility]. Strongly encouraged to keep at it.  

## 2020-05-02 NOTE — Progress Notes (Signed)
Chief Complaint:   OBESITY James Gross is here to discuss his progress with his obesity treatment plan along with follow-up of his obesity related diagnoses. James Gross is on the Category 4 Plan and states he is following his eating plan approximately 80% of the time. James Gross states he is walking for 30 minutes 3 times per week.  Today's visit was #: 5 Starting weight: 471 lbs Starting date: 02/28/2020 Today's weight: 437 lbs Today's date: 04/30/2020 Total lbs lost to date: 34 lbs Total lbs lost since last in-office visit: 12 lbs Total weight loss percentage to date: -7.22%  Interim History: James Gross has been drinking 1 gallon of water per day, he says.  Started working a little more than usual.  He says he played basket ball and got more steps per day in than prior.  He has been using MFP to track 6,000-8,000 steps per day.  Hunger is controlled.  Denies cravings.  No issues with the plan.   Assessment/Plan:   1. Vitamin D deficiency James Gross has a history of Vitamin D deficiency with resultant generalized fatigue as his primary symptom.  he is taking vitamin D 50,000 IU weekly for this deficiency and tolerating it well without side-effect.  Most recent Vitamin D lab reviewed-  level: 11.1 on 02/28/2020.  Plan:  - Discussed importance of vitamin D (as well as calcium) to their health and wellbeing.   - We reviewed possible symptoms of low Vitamin D including low energy, depressed mood, muscle aches, joint aches, osteoporosis, etc.  - We discussed that low Vitamin D levels may be linked to an increased risk of cardiovascular events, and even increased risk of cancers, such as colon and breast.   - Educated pt that weight loss will likely improve availability of vitamin D, thus encouraged James Gross to continue with meal plan and their weight loss efforts to further improve this condition.   We will need to monitor levels regularly (every 3-4 mo on average) to keep levels within normal limits.    -Refill Vitamin D, Ergocalciferol, (DRISDOL) 1.25 MG (50000 UNIT) CAPS capsule; Take 1 capsule (50,000 Units total) by mouth every 7 (seven) days.  Dispense: 4 capsule; Refill: 0   2. Class 3 severe obesity with serious comorbidity and body mass index (BMI) of 60.0 to 69.9 in adult, unspecified obesity type (HCC)  James Gross is currently in the action stage of change. As such, his goal is to continue with weight loss efforts. He has agreed to the Category 4 Plan.   Exercise goals: For substantial health benefits, adults should do at least 150 minutes (2 hours and 30 minutes) a week of moderate-intensity, or 75 minutes (1 hour and 15 minutes) a week of vigorous-intensity aerobic physical activity, or an equivalent combination of moderate- and vigorous-intensity aerobic activity. Aerobic activity should be performed in episodes of at least 10 minutes, and preferably, it should be spread throughout the week. Increase exercise to 5 days per week and try to tell himself no watching Netflix until he has exercised for 30 minutes that day.  Behavioral modification strategies: increasing lean protein intake, increasing water intake, meal planning and cooking strategies, keeping healthy foods in the home and planning for success.  James Gross has agreed to follow-up with our clinic in 2 weeks. He was informed of the importance of frequent follow-up visits to maximize his success with intensive lifestyle modifications for his multiple health conditions.   Objective:   Blood pressure 102/67, pulse 77, temperature 98.4  F (36.9 C), height 5\' 8"  (1.727 m), weight (!) 437 lb (198.2 kg), SpO2 97 %. Body mass index is 66.45 kg/m.  General: Cooperative, alert, well developed, in no acute distress. HEENT: Conjunctivae and lids unremarkable. Cardiovascular: Regular rhythm.  Lungs: Normal work of breathing. Neurologic: No focal deficits.   Lab Results  Component Value Date   CREATININE 0.65 (L) 02/28/2020   BUN 10  02/28/2020   NA 141 02/28/2020   K 4.4 02/28/2020   CL 101 02/28/2020   CO2 26 02/28/2020   Lab Results  Component Value Date   ALT 50 (H) 02/28/2020   AST 26 02/28/2020   ALKPHOS 96 02/28/2020   BILITOT 0.3 02/28/2020   Lab Results  Component Value Date   HGBA1C 6.6 (H) 02/28/2020   HGBA1C 5.9 07/27/2012   Lab Results  Component Value Date   INSULIN 80.6 (H) 02/28/2020   Lab Results  Component Value Date   TSH 2.690 02/28/2020   Lab Results  Component Value Date   CHOL 180 (H) 02/28/2020   HDL 49 02/28/2020   LDLCALC 114 (H) 02/28/2020   TRIG 91 (H) 02/28/2020   CHOLHDL 3.7 02/28/2020   Lab Results  Component Value Date   WBC 3.9 02/28/2020   HGB 13.1 02/28/2020   HCT 42.2 02/28/2020   MCV 81 02/28/2020   PLT 322 02/28/2020   Attestation Statements:   Reviewed by clinician on day of visit: allergies, medications, problem list, medical history, surgical history, family history, social history, and previous encounter notes.  I, 03/01/2020, CMA, am acting as Insurance claims handler for Energy manager, DO.  I have reviewed the above documentation for accuracy and completeness, and I agree with the above. [employer], D.O.  The 21st Century Cures Act was signed into law in 2016 which includes the topic of electronic health records.  This provides immediate access to information in MyChart.  This includes consultation notes, operative notes, office notes, lab results and pathology reports.  If you have any questions about what you read please let 2017 know at your next visit so we can discuss your concerns and take corrective action if need be.  We are right here with you.

## 2020-05-02 NOTE — Assessment & Plan Note (Signed)
He benefits from CPAP when used. Working on improved compliance. Pressure setting appropriate. Plan- continue auto 12-18. Adjust humidifier, use Biotene for dry mouth

## 2020-05-02 NOTE — Assessment & Plan Note (Signed)
He is making progress working with [facility]. Strongly encouraged to keep at it.

## 2020-05-02 NOTE — Patient Instructions (Signed)
Order- flu vax standard  For the dry mouth- try adjusting the humidifier up a little on your machine, until it is more comfortable. The home care company [employer], can help if you have questions  You can also try otc Biotene mouth rinse as needed for dry mouth.  Out goal with CPAP is to use it at least 4 hours per night. If it isn't comfortable please let us know so we can help.  I am really proud of your weight loss- stick with it- it will make your life better.  Please call if we can help

## 2020-05-14 ENCOUNTER — Ambulatory Visit (INDEPENDENT_AMBULATORY_CARE_PROVIDER_SITE_OTHER): Payer: Medicaid Other | Admitting: Family Medicine

## 2020-05-14 ENCOUNTER — Encounter (INDEPENDENT_AMBULATORY_CARE_PROVIDER_SITE_OTHER): Payer: Self-pay | Admitting: Family Medicine

## 2020-05-14 ENCOUNTER — Other Ambulatory Visit: Payer: Self-pay

## 2020-05-14 VITALS — BP 108/62 | HR 88 | Temp 98.9°F | Ht 68.0 in | Wt >= 6400 oz

## 2020-05-14 DIAGNOSIS — E1169 Type 2 diabetes mellitus with other specified complication: Secondary | ICD-10-CM

## 2020-05-14 DIAGNOSIS — E559 Vitamin D deficiency, unspecified: Secondary | ICD-10-CM | POA: Diagnosis not present

## 2020-05-14 DIAGNOSIS — K760 Fatty (change of) liver, not elsewhere classified: Secondary | ICD-10-CM | POA: Diagnosis not present

## 2020-05-14 DIAGNOSIS — Z9989 Dependence on other enabling machines and devices: Secondary | ICD-10-CM

## 2020-05-14 DIAGNOSIS — G4733 Obstructive sleep apnea (adult) (pediatric): Secondary | ICD-10-CM | POA: Diagnosis not present

## 2020-05-15 DIAGNOSIS — G4733 Obstructive sleep apnea (adult) (pediatric): Secondary | ICD-10-CM | POA: Insufficient documentation

## 2020-05-15 DIAGNOSIS — E119 Type 2 diabetes mellitus without complications: Secondary | ICD-10-CM | POA: Insufficient documentation

## 2020-05-15 DIAGNOSIS — Z9989 Dependence on other enabling machines and devices: Secondary | ICD-10-CM | POA: Insufficient documentation

## 2020-05-15 DIAGNOSIS — K760 Fatty (change of) liver, not elsewhere classified: Secondary | ICD-10-CM | POA: Insufficient documentation

## 2020-05-15 DIAGNOSIS — E559 Vitamin D deficiency, unspecified: Secondary | ICD-10-CM | POA: Insufficient documentation

## 2020-05-16 NOTE — Progress Notes (Signed)
Chief Complaint:   OBESITY James Gross is here to discuss his progress with his obesity treatment plan along with follow-up of his obesity related diagnoses. James Gross is on the Category 4 Plan and states he is following his eating plan approximately 80% of the time. James Gross states he is walking for 30 minutes 3 times per week.  Today's visit was #: 6 Starting weight: 471 lbs Starting date: 02/28/2020 Today's weight: 435 lbs Today's date: 05/14/2020 Total lbs lost to date: 36 lbs Total lbs lost since last in-office visit: 2 lbs Total weight loss percentage to date: -7.64%  Interim History:  - Even though he is prescribed the CAT 4 plan, James Gross is using MFP and hitting 2200-2400 calories per day and 120+ protein.   He thinks this helps him stay on task and pay attention to what he eats.  He knows what types of foods to eat and feels in control.    - Says hunger is well controlled and he is having no cravings.  No problems or concerns with the plan.   - Did not start walking much because it is too cold outside now.  However, over Halloween, he walked over 5 miles that day in trick or treating with family members- no problems with doing so.  Plan:   - We discussed his RMR of 3600 and the fact that he is eating more and eating on plan now without difficulty getting all foods/ calories in.   We decided to increase his caloric intake today.    Assessment/Plan:    Lab Orders     Hemoglobin A1c     VITAMIN D 25 Hydroxy (Vit-D Deficiency, Fractures)     ALT    1. Type 2 diabetes mellitus with other specified complication, without long-term current use of insulin (HCC) A1c at the end of August was 6.5.  Tolerating metformin 1000 mg twice daily well without issue.  No new symptoms or concerns.  Sweets cravings are nonexistent.  He did not eat one piece of candy over Halloween and walked for 5 miles.  Plan:  Continue metformin, check A1c at the end of November.  Continue prudent nutritional plan,  weight loss.  Add exercise.  Lab Results  Component Value Date   HGBA1C 6.6 (H) 02/28/2020   HGBA1C 5.9 07/27/2012   Lab Results  Component Value Date   LDLCALC 114 (H) 02/28/2020   CREATININE 0.65 (L) 02/28/2020   Lab Results  Component Value Date   INSULIN 80.6 (H) 02/28/2020   - Hemoglobin A1c     2. OSA on CPAP James Gross has a diagnosis of sleep apnea. He reports that he is using a CPAP regularly.  Recently seen by Dr. Maple Hudson in Pulmonology, which I reviewed those OV notes.  Compliance improved from 3% in July to 27% recently.  Pulmonary recommends nightly use.  Plan:  Follow recommendation of Sleep Medicine.  Agree that improved compliance may help with weight loss.  Use nightly.  Discussed importance of this with pt again today and discussed pathophysiology why.     3. Vitamin D deficiency James Gross's Vitamin D level was 11.1 on 02/28/2020. He is currently taking prescription vitamin D 50,000 IU each week. He denies nausea, vomiting or muscle weakness.  Plan:  Continue vitamin D.  Recheck labs at the end of November.  Continue weight loss.  - VITAMIN D 25 Hydroxy (Vit-D Deficiency, Fractures)    4. Nonalcoholic hepatosteatosis ALT 50 when checked on 02/28/2020.  Plan:  Recheck ALT at next office visit as well.  - ALT    5. Class 3 severe obesity with serious comorbidity and body mass index (BMI) of 60.0 to 69.9 in adult, unspecified obesity type (HCC)  James Gross is currently in the action stage of change. As such, his goal is to continue with weight loss efforts. He has agreed to keeping a food journal and adhering to recommended goals of 2600-2700 calories and 140-160 grams of protein.    Exercise goals: Start walking for 30 minutes 5 days per week.    Behavioral modification strategies: increasing lean protein intake, decreasing simple carbohydrates and no skipping meals.  James Gross has agreed to follow-up with our clinic in 2 weeks after increasing caloric intake today.  He  will need labs at next office visit (vitamin D, A1c, ALT). He was informed of the importance of frequent follow-up visits to maximize his success with intensive lifestyle modifications for his multiple health conditions.     Objective:   Blood pressure 108/62, pulse 88, temperature 98.9 F (37.2 C), height 5\' 8"  (1.727 m), weight (!) 435 lb (197.3 kg), SpO2 96 %. Body mass index is 66.14 kg/m.  General: Cooperative, alert, well developed, in no acute distress. HEENT: Conjunctivae and lids unremarkable. Cardiovascular: Regular rhythm.  Lungs: Normal work of breathing. Neurologic: No focal deficits.   Lab Results  Component Value Date   CREATININE 0.65 (L) 02/28/2020   BUN 10 02/28/2020   NA 141 02/28/2020   K 4.4 02/28/2020   CL 101 02/28/2020   CO2 26 02/28/2020   Lab Results  Component Value Date   ALT 50 (H) 02/28/2020   AST 26 02/28/2020   ALKPHOS 96 02/28/2020   BILITOT 0.3 02/28/2020   Lab Results  Component Value Date   HGBA1C 6.6 (H) 02/28/2020   HGBA1C 5.9 07/27/2012   Lab Results  Component Value Date   INSULIN 80.6 (H) 02/28/2020   Lab Results  Component Value Date   TSH 2.690 02/28/2020   Lab Results  Component Value Date   CHOL 180 (H) 02/28/2020   HDL 49 02/28/2020   LDLCALC 114 (H) 02/28/2020   TRIG 91 (H) 02/28/2020   CHOLHDL 3.7 02/28/2020   Lab Results  Component Value Date   WBC 3.9 02/28/2020   HGB 13.1 02/28/2020   HCT 42.2 02/28/2020   MCV 81 02/28/2020   PLT 322 02/28/2020     Attestation Statements:   Reviewed by clinician on day of visit: allergies, medications, problem list, medical history, surgical history, family history, social history, and previous encounter notes.  Time spent on visit including pre-visit chart review and post-visit care and charting was 30+ minutes.   I, 03/01/2020, CMA, am acting as Insurance claims handler for Energy manager, DO.  I have reviewed the above documentation for accuracy and completeness,  and I agree with the above. [employer], D.O.  The 21st Century Cures Act was signed into law in 2016 which includes the topic of electronic health records.  This provides immediate access to information in MyChart.  This includes consultation notes, operative notes, office notes, lab results and pathology reports.  If you have any questions about what you read please let 2017 know at your next visit so we can discuss your concerns and take corrective action if need be.  We are right here with you.

## 2020-05-28 ENCOUNTER — Ambulatory Visit (INDEPENDENT_AMBULATORY_CARE_PROVIDER_SITE_OTHER): Payer: Medicaid Other | Admitting: Family Medicine

## 2020-05-28 ENCOUNTER — Other Ambulatory Visit: Payer: Self-pay

## 2020-05-28 ENCOUNTER — Encounter (INDEPENDENT_AMBULATORY_CARE_PROVIDER_SITE_OTHER): Payer: Self-pay | Admitting: Family Medicine

## 2020-05-28 VITALS — BP 101/66 | HR 73 | Temp 98.6°F | Ht 68.0 in | Wt >= 6400 oz

## 2020-05-28 DIAGNOSIS — E785 Hyperlipidemia, unspecified: Secondary | ICD-10-CM | POA: Diagnosis not present

## 2020-05-28 DIAGNOSIS — E1169 Type 2 diabetes mellitus with other specified complication: Secondary | ICD-10-CM | POA: Diagnosis not present

## 2020-05-28 DIAGNOSIS — E559 Vitamin D deficiency, unspecified: Secondary | ICD-10-CM

## 2020-05-28 DIAGNOSIS — K760 Fatty (change of) liver, not elsewhere classified: Secondary | ICD-10-CM | POA: Diagnosis not present

## 2020-05-28 DIAGNOSIS — Z6841 Body Mass Index (BMI) 40.0 and over, adult: Secondary | ICD-10-CM

## 2020-05-28 MED ORDER — VITAMIN D (ERGOCALCIFEROL) 1.25 MG (50000 UNIT) PO CAPS: 50000.0000 [IU] | ORAL_CAPSULE | ORAL | 0 refills | Status: DC

## 2020-05-30 NOTE — Progress Notes (Signed)
Chief Complaint:   OBESITY James Gross is here to discuss his progress with his obesity treatment plan along with follow-up of his obesity related diagnoses. James Gross is on keeping a food journal and adhering to recommended goals of 2600-2700 calories and 140-160 protein and states he is following his eating plan approximately 80% of the time. James Gross states he is walking 30 minutes 4 times per week.  Today's visit was #: 7 Starting weight: 471 lbs Starting date: 02/28/2020 Today's weight: 425 lbs Today's date: 05/28/2020 Total lbs lost to date: 46 lbs Total lbs lost since last in-office visit: 10 lbs Total weight loss percentage to date: -9.77%  Interim History: Since his last office visit, we increased James Gross's calories and proteins and he feels more satisfied, "more full". James Gross denies hunger or cravings at all. 140-160 grams of protein per day. James Gross is witting his calories each day and tracking everything on My Fitness Pal. He has no issues or concerns.  Assessment/Plan:   Meds ordered this encounter  Medications  . Vitamin D, Ergocalciferol, (DRISDOL) 1.25 MG (50000 UNIT) CAPS capsule    Sig: Take 1 capsule (50,000 Units total) by mouth every 7 (seven) days.    Dispense:  4 capsule    Refill:  0    Orders Placed This Encounter  Procedures  . Insulin, random  . Hemoglobin A1c  . VITAMIN D 25 Hydroxy (Vit-D Deficiency, Fractures)  . Lipid panel  . ALT    1. Type 2 diabetes mellitus with other specified complication, without long-term current use of insulin (HCC) James Gross's A1c was 6.6 about 3 months ago. His fasting insulin is almost 40. He is on metformin and tolerating it well.  Plan: Consider GLP-1 in the future to aid with weight loss. James Gross will increase exercise. We will recheck A1c and FI at his next office visit. James Gross will come fasting.  - Insulin, random - Hemoglobin A1c    2. Hyperlipidemia associated with type 2 diabetes mellitus (HCC) James Gross is on no medication. He has  hyperlipidemia and has been trying to improve his cholesterol levels with intensive lifestyle modification including a low saturated fat diet, exercise and weight loss. He denies any chest pain, claudication or myalgias.  Lab Results  Component Value Date   ALT 50 (H) 02/28/2020   AST 26 02/28/2020   ALKPHOS 96 02/28/2020   BILITOT 0.3 02/28/2020   Lab Results  Component Value Date   CHOL 180 (H) 02/28/2020   HDL 49 02/28/2020   LDLCALC 114 (H) 02/28/2020   TRIG 91 (H) 02/28/2020   CHOLHDL 3.7 02/28/2020   Plan: We will recheck fasting lipid panel at his next office visit. James Gross will come fasting.He will continue with low saturated and trans fat meals and increase exercise.  - Lipid panel - ALT    3. Nonalcoholic fatty liver James Gross had an elevated ALT at his last office visit.  Plan: Check CMP at next office visit.     4. Vitamin D deficiency James Gross's Vitamin D level was 11.1 on 02/28/2020. He is currently taking prescription vitamin D 50,000 IU each week. He denies nausea, vomiting or muscle weakness. He is tolerating medication well without side effects.  Plan: Refill Vit D for 1 month. Recheck Vit D level at next office visit.  Refill- Vitamin D, Ergocalciferol, (DRISDOL) 1.25 MG (50000 UNIT) CAPS capsule; Take 1 capsule (50,000 Units total) by mouth every 7 (seven) days.  Dispense: 4 capsule; Refill: 0  - VITAMIN D 25  Hydroxy (Vit-D Deficiency, Fractures)    5. Class 3 severe obesity with serious comorbidity and body mass index (BMI) of 60.0 to 69.9 in adult, unspecified obesity type (HCC) James Gross is currently in the action stage of change. As such, his goal is to continue with weight loss efforts. He has agreed to the Category 1 Plan.   Exercise goals: As is  Behavioral modification strategies: increasing water intake, holiday eating strategies  and celebration eating strategies.  James Gross has agreed to follow-up with our clinic in 2 weeks. We will get blood work at that  time. He will come fasting for Insulin, A1c, ALT, Vit D, and FLP. He was informed of the importance of frequent follow-up visits to maximize his success with intensive lifestyle modifications for his multiple health conditions.   Objective:   Blood pressure 101/66, pulse 73, temperature 98.6 F (37 C), height 5\' 8"  (1.727 m), weight (!) 425 lb (192.8 kg), SpO2 98 %. Body mass index is 64.62 kg/m.  General: Cooperative, alert, well developed, in no acute distress. HEENT: Conjunctivae and lids unremarkable. Cardiovascular: Regular rhythm.  Lungs: Normal work of breathing. Neurologic: No focal deficits.   Lab Results  Component Value Date   CREATININE 0.65 (L) 02/28/2020   BUN 10 02/28/2020   NA 141 02/28/2020   K 4.4 02/28/2020   CL 101 02/28/2020   CO2 26 02/28/2020   Lab Results  Component Value Date   ALT 50 (H) 02/28/2020   AST 26 02/28/2020   ALKPHOS 96 02/28/2020   BILITOT 0.3 02/28/2020   Lab Results  Component Value Date   HGBA1C 6.6 (H) 02/28/2020   HGBA1C 5.9 07/27/2012   Lab Results  Component Value Date   INSULIN 80.6 (H) 02/28/2020   Lab Results  Component Value Date   TSH 2.690 02/28/2020   Lab Results  Component Value Date   CHOL 180 (H) 02/28/2020   HDL 49 02/28/2020   LDLCALC 114 (H) 02/28/2020   TRIG 91 (H) 02/28/2020   CHOLHDL 3.7 02/28/2020   Lab Results  Component Value Date   WBC 3.9 02/28/2020   HGB 13.1 02/28/2020   HCT 42.2 02/28/2020   MCV 81 02/28/2020   PLT 322 02/28/2020    Attestation Statements:   Reviewed by clinician on day of visit: allergies, medications, problem list, medical history, surgical history, family history, social history, and previous encounter notes.  03/01/2020, am acting as Edmund Hilda for Energy manager, DO.  I have reviewed the above documentation for accuracy and completeness, and I agree with the above. [employer], D.O.  The 21st Century Cures Act was signed into law in  2016 which includes the topic of electronic health records.  This provides immediate access to information in MyChart.  This includes consultation notes, operative notes, office notes, lab results and pathology reports.  If you have any questions about what you read please let 2017 know at your next visit so we can discuss your concerns and take corrective action if need be.  We are right here with you.

## 2020-06-11 ENCOUNTER — Encounter (INDEPENDENT_AMBULATORY_CARE_PROVIDER_SITE_OTHER): Payer: Self-pay | Admitting: Family Medicine

## 2020-06-11 ENCOUNTER — Ambulatory Visit (INDEPENDENT_AMBULATORY_CARE_PROVIDER_SITE_OTHER): Payer: Medicaid Other | Admitting: Family Medicine

## 2020-06-11 ENCOUNTER — Other Ambulatory Visit: Payer: Self-pay

## 2020-06-11 VITALS — BP 107/69 | HR 84 | Temp 98.3°F | Ht 68.0 in | Wt >= 6400 oz

## 2020-06-11 DIAGNOSIS — E785 Hyperlipidemia, unspecified: Secondary | ICD-10-CM

## 2020-06-11 DIAGNOSIS — E1169 Type 2 diabetes mellitus with other specified complication: Secondary | ICD-10-CM | POA: Diagnosis not present

## 2020-06-11 DIAGNOSIS — Z6841 Body Mass Index (BMI) 40.0 and over, adult: Secondary | ICD-10-CM

## 2020-06-11 DIAGNOSIS — E559 Vitamin D deficiency, unspecified: Secondary | ICD-10-CM | POA: Diagnosis not present

## 2020-06-12 LAB — LIPID PANEL
Chol/HDL Ratio: 4.7 ratio (ref 0.0–5.0)
Cholesterol, Total: 193 mg/dL (ref 100–199)
HDL: 41 mg/dL (ref >39)
LDL Chol Calc (NIH): 137 mg/dL — ABNORMAL HIGH (ref 0–99)
Triglycerides: 84 mg/dL (ref 0–149)
VLDL Cholesterol Cal: 15 mg/dL (ref 5–40)

## 2020-06-12 LAB — HEMOGLOBIN A1C
Est. average glucose Bld gHb Est-mCnc: 126 mg/dL
Hgb A1c MFr Bld: 6 % — ABNORMAL HIGH (ref 4.8–5.6)

## 2020-06-12 LAB — ALT: ALT: 65 IU/L — ABNORMAL HIGH (ref 0–44)

## 2020-06-12 LAB — VITAMIN D 25 HYDROXY (VIT D DEFICIENCY, FRACTURES): Vit D, 25-Hydroxy: 20.6 ng/mL — ABNORMAL LOW (ref 30.0–100.0)

## 2020-06-12 LAB — INSULIN, RANDOM: INSULIN: 30.8 u[IU]/mL — ABNORMAL HIGH (ref 2.6–24.9)

## 2020-06-12 NOTE — Progress Notes (Signed)
Chief Complaint:   OBESITY James Gross is here to discuss his progress with his obesity treatment plan along with follow-up of his obesity related diagnoses. [name] is on the Category 4 and states he is following his eating plan approximately 70% of the time. [name] states he is walking 30 minutes 3 times per week.  Today's visit was #: 8 Starting weight: 271 lbs Starting date: 02/28/2020 Today's weight: 420 lbs Today's date: 06/11/2020 Total lbs lost to date: 51 lbs Total lbs lost since last in-office visit: 5 lbs Total weight loss percentage to date: -10.83%  Interim History: James Gross reports that he is not as healthy after 2-3 weeks as usual but kept calories within range of 2600-2700 calories a day and still hit 140+ grams of protein daily. He is disappointed that he didn't lose more weight.  Plan: [name] is fasting for bloodwork.  Assessment/Plan:   1. Type 2 diabetes mellitus with other specified complication, without long-term current use of insulin (HCC) [name] is prescribed Metformin but is not taking it as written. He reports that he has been rarely takeing it for the past 1-2 months. His A1c was 6.6 4 months ago.  Medications reviewed. Diabetic ROS: no polyuria or polydipsia, no chest pain, dyspnea or TIA's, no numbness, tingling or pain in extremities, weight has increased.   Lab Results  Component Value Date   HGBA1C 6.0 (H) 06/11/2020   HGBA1C 6.6 (H) 02/28/2020   HGBA1C 5.9 07/27/2012   Lab Results  Component Value Date   LDLCALC 137 (H) 06/11/2020   CREATININE 0.65 (L) 02/28/2020   Lab Results  Component Value Date   INSULIN 30.8 (H) 06/11/2020   INSULIN 80.6 (H) 02/28/2020    Plan: Check A1c and fasting insulin today. Continue prudent nutritional plan and weight loss.  2. Hyperlipidemia associated with type 2 diabetes mellitus (HCC)  [name] has hyperlipidemia and has been trying to improve his cholesterol levels with intensive lifestyle modification including a low  saturated fat diet, exercise and weight loss. He denies any chest pain, claudication or myalgias.  Lab Results  Component Value Date   ALT 65 (H) 06/11/2020   AST 26 02/28/2020   ALKPHOS 96 02/28/2020   BILITOT 0.3 02/28/2020   Lab Results  Component Value Date   CHOL 193 06/11/2020   HDL 41 06/11/2020   LDLCALC 137 (H) 06/11/2020   TRIG 84 06/11/2020   CHOLHDL 4.7 06/11/2020   Plan: Check FLP and ALT today.  3. Vitamin D deficiency James Gross's Vitamin D level was 11.1 on 02/28/2020. He is currently taking prescription vitamin D 50,000 IU each week. He denies nausea, vomiting or muscle weakness.  Plan: Check Vit D level today. Low Vitamin D level contributes to fatigue and are associated with obesity, breast, and colon cancer. He agrees to continue to take prescription Vitamin D @50 ,000 IU every week and will follow-up for routine testing of Vitamin D, at least 2-3 times per year to avoid over-replacement.  4. Class 3 severe obesity with serious comorbidity and body mass index (BMI) of 60.0 to 69.9 in adult, unspecified obesity type (HCC) James Gross is currently in the action stage of change. As such, his goal is to continue with weight loss efforts. He has agreed to change to the plan of keeping a food journal and adhering to recommended goals of 2600-2700 calories and 140 grams protein.   Exercise goals: For substantial health benefits, adults should do at least 150 minutes (2 hours and 30 minutes)  a week of moderate-intensity, or 75 minutes (1 hour and 15 minutes) a week of vigorous-intensity aerobic physical activity, or an equivalent combination of moderate- and vigorous-intensity aerobic activity. Aerobic activity should be performed in episodes of at least 10 minutes, and preferably, it should be spread throughout the week.  Behavioral modification strategies: keeping healthy foods in the home, better snacking choices and planning for success.  James Gross has agreed to follow-up with our clinic  in 2 weeks. He was informed of the importance of frequent follow-up visits to maximize his success with intensive lifestyle modifications for his multiple health conditions.   James Gross was informed we would discuss his lab results at his next visit unless there is a critical issue that needs to be addressed sooner. James Gross agreed to keep his next visit at the agreed upon time to discuss these results.  Objective:   Blood pressure 107/69, pulse 84, temperature 98.3 F (36.8 C), height 5\' 8"  (1.727 m), weight (!) 420 lb (190.5 kg), SpO2 96 %. Body mass index is 63.86 kg/m.  General: Cooperative, alert, well developed, in no acute distress. HEENT: Conjunctivae and lids unremarkable. Cardiovascular: Regular rhythm.  Lungs: Normal work of breathing. Neurologic: No focal deficits.   Lab Results  Component Value Date   CREATININE 0.65 (L) 02/28/2020   BUN 10 02/28/2020   NA 141 02/28/2020   K 4.4 02/28/2020   CL 101 02/28/2020   CO2 26 02/28/2020   Lab Results  Component Value Date   ALT 65 (H) 06/11/2020   AST 26 02/28/2020   ALKPHOS 96 02/28/2020   BILITOT 0.3 02/28/2020   Lab Results  Component Value Date   HGBA1C 6.0 (H) 06/11/2020   HGBA1C 6.6 (H) 02/28/2020   HGBA1C 5.9 07/27/2012   Lab Results  Component Value Date   INSULIN 30.8 (H) 06/11/2020   INSULIN 80.6 (H) 02/28/2020   Lab Results  Component Value Date   TSH 2.690 02/28/2020   Lab Results  Component Value Date   CHOL 193 06/11/2020   HDL 41 06/11/2020   LDLCALC 137 (H) 06/11/2020   TRIG 84 06/11/2020   CHOLHDL 4.7 06/11/2020   Lab Results  Component Value Date   WBC 3.9 02/28/2020   HGB 13.1 02/28/2020   HCT 42.2 02/28/2020   MCV 81 02/28/2020   PLT 322 02/28/2020    Attestation Statements:   Reviewed by clinician on day of visit: allergies, medications, problem list, medical history, surgical history, family history, social history, and previous encounter notes.  03/01/2020, am acting as  Edmund Hilda for Energy manager, DO.  I have reviewed the above documentation for accuracy and completeness, and I agree with the above. [employer], D.O.  The 21st Century Cures Act was signed into law in 2016 which includes the topic of electronic health records.  This provides immediate access to information in MyChart.  This includes consultation notes, operative notes, office notes, lab results and pathology reports.  If you have any questions about what you read please let 2017 know at your next visit so we can discuss your concerns and take corrective action if need be.  We are right here with you.

## 2020-06-25 ENCOUNTER — Other Ambulatory Visit: Payer: Self-pay

## 2020-06-25 ENCOUNTER — Ambulatory Visit (INDEPENDENT_AMBULATORY_CARE_PROVIDER_SITE_OTHER): Payer: Medicaid Other | Admitting: Family Medicine

## 2020-06-25 ENCOUNTER — Encounter (INDEPENDENT_AMBULATORY_CARE_PROVIDER_SITE_OTHER): Payer: Self-pay | Admitting: Family Medicine

## 2020-06-25 VITALS — BP 96/62 | HR 76 | Temp 98.1°F | Ht 68.0 in | Wt >= 6400 oz

## 2020-06-25 DIAGNOSIS — E559 Vitamin D deficiency, unspecified: Secondary | ICD-10-CM

## 2020-06-25 DIAGNOSIS — Z6841 Body Mass Index (BMI) 40.0 and over, adult: Secondary | ICD-10-CM

## 2020-06-25 DIAGNOSIS — Z9189 Other specified personal risk factors, not elsewhere classified: Secondary | ICD-10-CM | POA: Insufficient documentation

## 2020-06-25 DIAGNOSIS — E1169 Type 2 diabetes mellitus with other specified complication: Secondary | ICD-10-CM

## 2020-06-25 DIAGNOSIS — K760 Fatty (change of) liver, not elsewhere classified: Secondary | ICD-10-CM | POA: Diagnosis not present

## 2020-06-25 DIAGNOSIS — E785 Hyperlipidemia, unspecified: Secondary | ICD-10-CM

## 2020-06-25 MED ORDER — METFORMIN HCL 1000 MG PO TABS: 1000.0000 mg | ORAL_TABLET | Freq: Two times a day (BID) | ORAL | 0 refills | Status: DC

## 2020-06-25 MED ORDER — VITAMIN D (ERGOCALCIFEROL) 1.25 MG (50000 UNIT) PO CAPS: ORAL_CAPSULE | ORAL | 0 refills | Status: DC

## 2020-06-25 NOTE — Progress Notes (Signed)
Chief Complaint:   OBESITY James Gross is here to discuss his progress with his obesity treatment plan along with follow-up of his obesity related diagnoses. James Gross is on keeping a food journal and adhering to recommended goals of 2600-2700 calories and 140 g protein and states he is following his eating plan approximately 80% of the time. James Gross states he is walking 40 minutes 5 times per week.  Today's visit was #: 9 Starting weight: 471 lbs Starting date: 02/28/2020 Today's weight: 417 lbs Today's date: 06/25/2020 Total lbs lost to date: 54 lbs Total lbs lost since last in-office visit: 3 lbs Total weight loss percentage to date: -11.46%  Interim History: James Gross reports eating 120-180 g of protein but occasionally goes over 200 g. He walks 10,000 steps 4-5 days a week and 6-8,000 other days. James Gross has not been taking any of his medication. He is here today to review labs that were done last OV, as well.  Plan: Increase physical activity, take all medications, and decrease saturated fats.  Assessment/Plan:   1. Type 2 diabetes mellitus with other specified complication, without long-term current use of insulin (HCC) Discussed labs with patient today. James Gross has been more consistent with Metformin on after 2 weeks and prior he wasn't really taking it at all.  Medications reviewed. Diabetic ROS: no polyuria or polydipsia, no chest pain, dyspnea or TIA's, no numbness, tingling or pain in extremities.   Lab Results  Component Value Date   HGBA1C 6.0 (H) 06/11/2020   HGBA1C 6.6 (H) 02/28/2020   HGBA1C 5.9 07/27/2012   Lab Results  Component Value Date   LDLCALC 137 (H) 06/11/2020   CREATININE 0.65 (L) 02/28/2020   Lab Results  Component Value Date   INSULIN 30.8 (H) 06/11/2020   INSULIN 80.6 (H) 02/28/2020   Plan: Refill Metformin for 1 month, as per below. Good blood sugar control is important to decrease the likelihood of diabetic complications such as nephropathy, neuropathy, limb  loss, blindness, coronary artery disease, and death. Intensive lifestyle modification including diet, exercise and weight loss are the first line of treatment for diabetes.   Refill- metFORMIN (GLUCOPHAGE) 1000 MG tablet; Take 1 tablet (1,000 mg total) by mouth 2 (two) times daily with a meal.  Dispense: 60 tablet; Refill: 0  2. Hyperlipidemia associated with type 2 diabetes mellitus (HCC) Discussed labs with patient today.  James Gross has hyperlipidemia and has been trying to improve his cholesterol levels with intensive lifestyle modification including a low saturated fat diet, exercise and weight loss. He denies any chest pain, claudication or myalgias.  Lab Results  Component Value Date   ALT 65 (H) 06/11/2020   AST 26 02/28/2020   ALKPHOS 96 02/28/2020   BILITOT 0.3 02/28/2020   Lab Results  Component Value Date   CHOL 193 06/11/2020   HDL 41 06/11/2020   LDLCALC 137 (H) 06/11/2020   TRIG 84 06/11/2020   CHOLHDL 4.7 06/11/2020   Plan: James Gross will follow up with PCP and cardiology regarding treatment of HLD. From our standpoint, he will continue weight loss, increase activity, and ensure low saturated fat meals.  3. NAFLD (nonalcoholic fatty liver disease) Discussed labs with patient today. James Gross's ALT is elevated from previous check.  James Gross has a new dx of elevated ALT. His BMI is over 40. He denies abdominal pain or jaundice and has never been told of any liver problems in the past. He denies excessive alcohol intake.  Lab Results  Component Value Date  ALT 65 (H) 06/11/2020   AST 26 02/28/2020   ALKPHOS 96 02/28/2020   BILITOT 0.3 02/28/2020   Plan: Decrease saturated fasts and fatty meats. We discussed the likely diagnosis of non-alcoholic fatty liver disease today and how this condition is obesity related. James Gross was educated the importance of weight loss. [name] agreed to continue with his weight loss efforts with healthier diet and exercise as an essential part of his treatment  plan.  4. Vitamin D deficiency Discussed labs with patient today. James Gross has not been taking his Vit D medication. James Gross's Vitamin D level was 20.6 on 06/11/2020. He is currently taking prescription vitamin D 50,000 IU each week. He denies nausea, vomiting or muscle weakness.   Ref. Range 06/11/2020 14:53  Vitamin D, 25-Hydroxy Latest Ref Range: 30.0 - 100.0 ng/mL 20.6 (L)   Plan: Refill Vit D for 1 month but James Gross wishes to change to twice a week, as per below. Low Vitamin D level contributes to fatigue and are associated with obesity, breast, and colon cancer. He agrees to continue to take prescription Vitamin D @50 ,000 IU every week and will follow-up for routine testing of Vitamin D, at least 2-3 times per year to avoid over-replacement.  Refill with increased dose- Vitamin D, Ergocalciferol, (DRISDOL) 1.25 MG (50000 UNIT) CAPS capsule; 1 po q wed and 1 po q sun  Dispense: 8 capsule; Refill: 0  5. At risk for heart disease James Gross was given approximately 30 minutes of coronary artery disease prevention counseling today. He is 19 y.o. male and has risk factors for heart disease including obesity. We discussed intensive lifestyle modifications today with an emphasis on specific weight loss instructions and strategies.   6. Class 3 severe obesity with serious comorbidity and body mass index (BMI) of 60.0 to 69.9 in adult, unspecified obesity type (HCC) James Gross is currently in the action stage of change. As such, his goal is to continue with weight loss efforts. He has agreed to keeping a food journal and adhering to recommended goals of 2600-2700 calories and 140 g protein.   Exercise goals: Squats, lunges, modified push-ups, planks with 30 second holds x 2, sit-ups  Behavioral modification strategies: increasing lean protein intake, holiday eating strategies  and planning for success.  James Gross has agreed to follow-up with our clinic in 2-3 weeks. He was informed of the importance of frequent follow-up  visits to maximize his success with intensive lifestyle modifications for his multiple health conditions.   Objective:   Blood pressure 96/62, pulse 76, temperature 98.1 F (36.7 C), height 5\' 8"  (1.727 m), weight (!) 417 lb (189.1 kg), SpO2 97 %. Body mass index is 63.4 kg/m.  General: Cooperative, alert, well developed, in no acute distress. HEENT: Conjunctivae and lids unremarkable. Cardiovascular: Regular rhythm.  Lungs: Normal work of breathing. Neurologic: No focal deficits.   Lab Results  Component Value Date   CREATININE 0.65 (L) 02/28/2020   BUN 10 02/28/2020   NA 141 02/28/2020   K 4.4 02/28/2020   CL 101 02/28/2020   CO2 26 02/28/2020   Lab Results  Component Value Date   ALT 65 (H) 06/11/2020   AST 26 02/28/2020   ALKPHOS 96 02/28/2020   BILITOT 0.3 02/28/2020   Lab Results  Component Value Date   HGBA1C 6.0 (H) 06/11/2020   HGBA1C 6.6 (H) 02/28/2020   HGBA1C 5.9 07/27/2012   Lab Results  Component Value Date   INSULIN 30.8 (H) 06/11/2020   INSULIN 80.6 (H) 02/28/2020  Lab Results  Component Value Date   TSH 2.690 02/28/2020   Lab Results  Component Value Date   CHOL 193 06/11/2020   HDL 41 06/11/2020   LDLCALC 137 (H) 06/11/2020   TRIG 84 06/11/2020   CHOLHDL 4.7 06/11/2020   Lab Results  Component Value Date   WBC 3.9 02/28/2020   HGB 13.1 02/28/2020   HCT 42.2 02/28/2020   MCV 81 02/28/2020   PLT 322 02/28/2020   Attestation Statements:   Reviewed by clinician on day of visit: allergies, medications, problem list, medical history, surgical history, family history, social history, and previous encounter notes.  Edmund Hilda, am acting as Energy manager for [employer], DO.  I have reviewed the above documentation for accuracy and completeness, and I agree with the above. Carlye Grippe, D.O.  The 21st Century Cures Act was signed into law in 2016 which includes the topic of electronic health records.  This provides  immediate access to information in MyChart.  This includes consultation notes, operative notes, office notes, lab results and pathology reports.  If you have any questions about what you read please let us know at your next visit so we can discuss your concerns and take corrective action if need be.  We are right here with you.

## 2020-07-09 ENCOUNTER — Encounter (INDEPENDENT_AMBULATORY_CARE_PROVIDER_SITE_OTHER): Payer: Self-pay | Admitting: Family Medicine

## 2020-07-09 ENCOUNTER — Other Ambulatory Visit: Payer: Self-pay

## 2020-07-09 ENCOUNTER — Ambulatory Visit (INDEPENDENT_AMBULATORY_CARE_PROVIDER_SITE_OTHER): Payer: Medicaid Other | Admitting: Family Medicine

## 2020-07-09 VITALS — BP 126/71 | HR 96 | Temp 98.4°F | Ht 68.0 in | Wt >= 6400 oz

## 2020-07-09 DIAGNOSIS — Z6841 Body Mass Index (BMI) 40.0 and over, adult: Secondary | ICD-10-CM

## 2020-07-09 DIAGNOSIS — E559 Vitamin D deficiency, unspecified: Secondary | ICD-10-CM

## 2020-07-09 DIAGNOSIS — E1169 Type 2 diabetes mellitus with other specified complication: Secondary | ICD-10-CM

## 2020-07-09 MED ORDER — VITAMIN D (ERGOCALCIFEROL) 1.25 MG (50000 UNIT) PO CAPS: ORAL_CAPSULE | ORAL | 0 refills | Status: DC

## 2020-07-09 NOTE — Progress Notes (Signed)
Chief Complaint:   OBESITY James Gross is here to discuss his progress with his obesity treatment plan along with follow-up of his obesity related diagnoses. James Gross is on keeping a food journal and adhering to recommended goals of 2600-2700 calories and 140 grams protein and states he is following his eating plan approximately 60% of the time. James Gross states he is walking 30 minutes 3 times per week.  Today's visit was #: 10 Starting weight: 471 lbs Starting date: 02/28/2020 Today's weight: 415 lbs Today's date: 07/10/2020 Total lbs lost to date: 56 lbs Total lbs lost since last in-office visit: 2 lbs Total weight loss percentage to date: -11.89%  Interim History: James Gross reports that he started having a runny nose and sore throat 2 days ago. He tells me he's not "real sick" and he did not get tested for COVID-19. James Gross did well over the holidays, practicing [employer] meals, and he walked 10,000 steps a day and purposely walked after his "heavy meals".   Assessment/Plan:   1. Type 2 diabetes mellitus with other specified complication, without long-term current use of insulin (HCC) James Gross reports that he is tolerating the Metformin twice a day and has been more consistent with taking it.   Plan: Continue Metformin twice a day. Continue prudent nutritional plan and weight loss. Recheck  A1c every 3-4 months and as needed.  2. Vitamin D deficiency We doubled James Gross's Vit D dose at his last OV. He is tolerating it well without side effects. James Gross's Vitamin D level was 20.6 on 06/11/2020. He is currently taking prescription vitamin D 50,000 IU as prescribed. He denies nausea, vomiting or muscle weakness.  Ref. Range 06/11/2020 14:53  Vitamin D, 25-Hydroxy Latest Ref Range: 30.0 - 100.0 ng/mL 20.6 (L)   Plan: Recheck Vit D level mid to late March, which is approximately every 2-3 months after increasing dose. Low Vitamin D level contributes to fatigue and are associated with obesity, breast, and colon cancer. He  agrees to continue to take prescription Vitamin D @50 ,000 IU every week and will follow-up for routine testing of Vitamin D, at least 2-3 times per year to avoid over-replacement. Refill Vit D for 1 month, as per below.   Refill- Vitamin D, Ergocalciferol, (DRISDOL) 1.25 MG (50000 UNIT) CAPS capsule; 1 po q wed and 1 po q sun  Dispense: 8 capsule; Refill: 0  3. Class 3 severe obesity with serious comorbidity and body mass index (BMI) of 60.0 to 69.9 in adult, unspecified obesity type (HCC) James Gross is currently in the action stage of change. As such, his goal is to continue with weight loss efforts. He has agreed to keeping a food journal and adhering to recommended goals of 2600-2700 calories and 140 grams protein.   Exercise goals: For substantial health benefits, adults should do at least 150 minutes (2 hours and 30 minutes) a week of moderate-intensity, or 75 minutes (1 hour and 15 minutes) a week of vigorous-intensity aerobic physical activity, or an equivalent combination of moderate- and vigorous-intensity aerobic activity. Aerobic activity should be performed in episodes of at least 10 minutes, and preferably, it should be spread throughout the week.  Behavioral modification strategies: meal planning and cooking strategies and planning for success.  [name] has agreed to follow-up with our clinic in 2 weeks. He was informed of the importance of frequent follow-up visits to maximize his success with intensive lifestyle modifications for his multiple health conditions.   Objective:   Blood pressure 126/71, pulse 96, temperature 98.4  F (36.9 C), height 5\' 8"  (1.727 m), weight (!) 415 lb (188.2 kg), SpO2 96 %. Body mass index is 63.1 kg/m.  General: Cooperative, alert, well developed, in no acute distress. HEENT: Conjunctivae and lids unremarkable. Cardiovascular: Regular rhythm.  Lungs: Normal work of breathing. Neurologic: No focal deficits.   Lab Results  Component Value Date    CREATININE 0.65 (L) 02/28/2020   BUN 10 02/28/2020   NA 141 02/28/2020   K 4.4 02/28/2020   CL 101 02/28/2020   CO2 26 02/28/2020   Lab Results  Component Value Date   ALT 65 (H) 06/11/2020   AST 26 02/28/2020   ALKPHOS 96 02/28/2020   BILITOT 0.3 02/28/2020   Lab Results  Component Value Date   HGBA1C 6.0 (H) 06/11/2020   HGBA1C 6.6 (H) 02/28/2020   HGBA1C 5.9 07/27/2012   Lab Results  Component Value Date   INSULIN 30.8 (H) 06/11/2020   INSULIN 80.6 (H) 02/28/2020   Lab Results  Component Value Date   TSH 2.690 02/28/2020   Lab Results  Component Value Date   CHOL 193 06/11/2020   HDL 41 06/11/2020   LDLCALC 137 (H) 06/11/2020   TRIG 84 06/11/2020   CHOLHDL 4.7 06/11/2020   Lab Results  Component Value Date   WBC 3.9 02/28/2020   HGB 13.1 02/28/2020   HCT 42.2 02/28/2020   MCV 81 02/28/2020   PLT 322 02/28/2020    Attestation Statements:   Reviewed by clinician on day of visit: allergies, medications, problem list, medical history, surgical history, family history, social history, and previous encounter notes.  03/01/2020, am acting as Edmund Hilda for Energy manager, DO.  I have reviewed the above documentation for accuracy and completeness, and I agree with the above. -[employer], D.O.  The 21st Century Cures Act was signed into law in 2016 which includes the topic of electronic health records.  This provides immediate access to information in MyChart.  This includes consultation notes, operative notes, office notes, lab results and pathology reports.  If you have any questions about what you read please let 2017 know at your next visit so we can discuss your concerns and take corrective action if need be.  We are right here with you.

## 2020-07-18 ENCOUNTER — Encounter (INDEPENDENT_AMBULATORY_CARE_PROVIDER_SITE_OTHER): Payer: Self-pay

## 2020-07-23 ENCOUNTER — Encounter (INDEPENDENT_AMBULATORY_CARE_PROVIDER_SITE_OTHER): Payer: Self-pay | Admitting: Family Medicine

## 2020-07-23 ENCOUNTER — Telehealth (INDEPENDENT_AMBULATORY_CARE_PROVIDER_SITE_OTHER): Payer: Medicaid Other | Admitting: Family Medicine

## 2020-07-23 ENCOUNTER — Other Ambulatory Visit: Payer: Self-pay

## 2020-07-23 DIAGNOSIS — E559 Vitamin D deficiency, unspecified: Secondary | ICD-10-CM

## 2020-07-23 DIAGNOSIS — E1169 Type 2 diabetes mellitus with other specified complication: Secondary | ICD-10-CM

## 2020-07-23 DIAGNOSIS — Z6841 Body Mass Index (BMI) 40.0 and over, adult: Secondary | ICD-10-CM

## 2020-07-25 NOTE — Progress Notes (Signed)
TeleHealth Visit:  Due to the COVID-19 pandemic, this visit was completed with telemedicine (audio/video) technology to reduce patient and provider exposure as well as to preserve personal protective equipment.   James Gross has verbally consented to this TeleHealth visit. The patient is located at home, the provider is located at the [facility] office. The participants in this visit include the listed provider and patient. The visit was conducted today via video.  Chief Complaint: OBESITY James Gross is here to discuss his progress with his obesity treatment plan along with follow-up of his obesity related diagnoses. James Gross is on keeping a food journal and adhering to recommended goals of 2600-2700 calories and 140 g protein and states he is following his eating plan approximately 80% of the time. James Gross states he is walking 30 minutes 3 times per week.  Today's visit was #: 10 Starting weight: 471 lbs Starting date: 02/28/2020  Interim History: James Gross reports that he thinks he's lost weight. Notes hunger and cravings are non-existent. "I am hitting my goals for protein and calories everyday." Patient wants to exercise to goal of 150 minutes a week in the near future.  Assessment/Plan:   1. Type 2 diabetes mellitus with other specified complication, without long-term current use of insulin (HCC) James Gross has been consistent with taking his Metformin BID lately. He thinks it works better taking as written. Patient denies side effects or concerns. Four months ago his A1c was 6.6 and recently decreased to 6.0.   Lab Results  Component Value Date   HGBA1C 6.0 (H) 06/11/2020   HGBA1C 6.6 (H) 02/28/2020   HGBA1C 5.9 07/27/2012   Lab Results  Component Value Date   LDLCALC 137 (H) 06/11/2020   CREATININE 0.65 (L) 02/28/2020   Lab Results  Component Value Date   INSULIN 30.8 (H) 06/11/2020   INSULIN 80.6 (H) 02/28/2020   Plan: Blanca denies a need for medication refill today. Good blood sugar  control is important to decrease the likelihood of diabetic complications such as nephropathy, neuropathy, limb loss, blindness, coronary artery disease, and death. Intensive lifestyle modification including diet, exercise and weight loss are the first line of treatment for diabetes.   2. Vitamin D deficiency James Gross's Vitamin D level was 20.6 on 06/11/2020. He is currently taking prescription vitamin D 50,000 IU twice a week and tolerating it well without side effects. He denies nausea, vomiting or muscle weakness. He denies a need for refill.  Ref. Range 06/11/2020 14:53  Vitamin D, 25-Hydroxy Latest Ref Range: 30.0 - 100.0 ng/mL 20.6 (L)   Plan: Continue current treatment plan. Low Vitamin D level contributes to fatigue and are associated with obesity, breast, and colon cancer. He agrees to continue to take prescription Vitamin D @50 ,000 IU every week and will follow-up for routine testing of Vitamin D, at least 2-3 times per year to avoid over-replacement.  3. Class 3 severe obesity with serious comorbidity and body mass index (BMI) of 60.0 to 69.9 in adult, unspecified obesity type (HCC) James Gross is currently in the action stage of change. As such, his goal is to continue with weight loss efforts. He has agreed to keeping a food journal and adhering to recommended goals of 2600-2700 calories and 140 g protein.   Exercise goals: Increase to: For substantial health benefits, adults should do at least 150 minutes (2 hours and 30 minutes) a week of moderate-intensity, or 75 minutes (1 hour and 15 minutes) a week of vigorous-intensity aerobic physical activity, or an equivalent  combination of moderate- and vigorous-intensity aerobic activity. Aerobic activity should be performed in episodes of at least 10 minutes, and preferably, it should be spread throughout the week. Use online exercise videos as needed if weather doesn't allow outside activity.  Behavioral modification strategies: increasing lean protein  intake, meal planning and cooking strategies, keeping healthy foods in the home and planning for success.  James Gross has agreed to follow-up with our clinic in 2 weeks on 08/06/2020 at 1200. He was informed of the importance of frequent follow-up visits to maximize his success with intensive lifestyle modifications for his multiple health conditions.  Objective:   VITALS: Per patient if applicable, see vitals. GENERAL: Alert and in no acute distress. CARDIOPULMONARY: No increased WOB. Speaking in clear sentences.  PSYCH: Pleasant and cooperative. Speech normal rate and rhythm. Affect is appropriate. Insight and judgement are appropriate. Attention is focused, linear, and appropriate.  NEURO: Oriented as arrived to appointment on time with no prompting.   Lab Results  Component Value Date   CREATININE 0.65 (L) 02/28/2020   BUN 10 02/28/2020   NA 141 02/28/2020   K 4.4 02/28/2020   CL 101 02/28/2020   CO2 26 02/28/2020   Lab Results  Component Value Date   ALT 65 (H) 06/11/2020   AST 26 02/28/2020   ALKPHOS 96 02/28/2020   BILITOT 0.3 02/28/2020   Lab Results  Component Value Date   HGBA1C 6.0 (H) 06/11/2020   HGBA1C 6.6 (H) 02/28/2020   HGBA1C 5.9 07/27/2012   Lab Results  Component Value Date   INSULIN 30.8 (H) 06/11/2020   INSULIN 80.6 (H) 02/28/2020   Lab Results  Component Value Date   TSH 2.690 02/28/2020   Lab Results  Component Value Date   CHOL 193 06/11/2020   HDL 41 06/11/2020   LDLCALC 137 (H) 06/11/2020   TRIG 84 06/11/2020   CHOLHDL 4.7 06/11/2020   Lab Results  Component Value Date   WBC 3.9 02/28/2020   HGB 13.1 02/28/2020   HCT 42.2 02/28/2020   MCV 81 02/28/2020   PLT 322 02/28/2020   No results found for: IRON, TIBC, FERRITIN  Attestation Statements:   Reviewed by clinician on day of visit: allergies, medications, problem list, medical history, surgical history, family history, social history, and previous encounter notes.  Edmund Hilda, am acting as Energy manager for [employer], DO.  I have reviewed the above documentation for accuracy and completeness, and I agree with the above. Carlye Grippe, D.O.  The 21st Century Cures Act was signed into law in 2016 which includes the topic of electronic health records.  This provides immediate access to information in MyChart.  This includes consultation notes, operative notes, office notes, lab results and pathology reports.  If you have any questions about what you read please let us know at your next visit so we can discuss your concerns and take corrective action if need be.  We are right here with you.

## 2020-08-01 ENCOUNTER — Encounter: Payer: Self-pay | Admitting: Internal Medicine

## 2020-08-06 ENCOUNTER — Ambulatory Visit (INDEPENDENT_AMBULATORY_CARE_PROVIDER_SITE_OTHER): Payer: Medicaid Other | Admitting: Family Medicine

## 2020-08-06 ENCOUNTER — Encounter (INDEPENDENT_AMBULATORY_CARE_PROVIDER_SITE_OTHER): Payer: Self-pay | Admitting: Family Medicine

## 2020-08-06 ENCOUNTER — Other Ambulatory Visit: Payer: Self-pay

## 2020-08-06 VITALS — BP 117/71 | HR 64 | Temp 98.6°F | Ht 68.0 in | Wt >= 6400 oz

## 2020-08-06 DIAGNOSIS — E559 Vitamin D deficiency, unspecified: Secondary | ICD-10-CM | POA: Diagnosis not present

## 2020-08-06 DIAGNOSIS — Z6841 Body Mass Index (BMI) 40.0 and over, adult: Secondary | ICD-10-CM

## 2020-08-06 DIAGNOSIS — E1169 Type 2 diabetes mellitus with other specified complication: Secondary | ICD-10-CM

## 2020-08-06 MED ORDER — VITAMIN D (ERGOCALCIFEROL) 1.25 MG (50000 UNIT) PO CAPS: ORAL_CAPSULE | ORAL | 0 refills | Status: DC

## 2020-08-06 MED ORDER — METFORMIN HCL 1000 MG PO TABS: 1000.0000 mg | ORAL_TABLET | Freq: Two times a day (BID) | ORAL | 0 refills | Status: DC

## 2020-08-07 NOTE — Progress Notes (Signed)
Chief Complaint:   OBESITY James Gross is here to discuss his progress with his obesity treatment plan along with follow-up of his obesity related diagnoses. James Gross is on keeping a food journal and adhering to recommended goals of 2600-2700 calories and 140 g protein and states he is following his eating plan approximately 80% of the time. James Gross states he is walking and doing functional strength training 30 minutes 5 times per week.  Today's visit was #: 11 Starting weight: 471 lbs Starting date: 02/28/2020 Today's weight: 414 lbs Today's date: 08/06/2020 Total lbs lost to date: 57 lbs Total lbs lost since last in-office visit: 1 lb Total weight loss percentage to date: -12.10%  Interim History: James Gross is doing YouTube video workouts every 5 days a week for 30 minutes, walks, and plays basketball several days a week. His hunger and cravings are controlled.  Plan: Increase water intake.  Assessment/Plan:   No orders of the defined types were placed in this encounter.   Medications Discontinued During This Encounter  Medication Reason  . metFORMIN (GLUCOPHAGE) 1000 MG tablet Reorder  . Vitamin D, Ergocalciferol, (DRISDOL) 1.25 MG (50000 UNIT) CAPS capsule Reorder     Meds ordered this encounter  Medications  . Vitamin D, Ergocalciferol, (DRISDOL) 1.25 MG (50000 UNIT) CAPS capsule    Sig: 1 po q wed and 1 po q sun    Dispense:  8 capsule    Refill:  0  . metFORMIN (GLUCOPHAGE) 1000 MG tablet    Sig: Take 1 tablet (1,000 mg total) by mouth 2 (two) times daily with a meal.    Dispense:  60 tablet    Refill:  0     1. Type 2 diabetes mellitus with other specified complication, without long-term current use of insulin (HCC) James Gross is not checking his blood sugar at home, as he has no glucometer. He is taking Metformin and tolerating it well without side effects.  Lab Results  Component Value Date   HGBA1C 6.0 (H) 06/11/2020   HGBA1C 6.6 (H) 02/28/2020   HGBA1C 5.9 07/27/2012   Lab  Results  Component Value Date   LDLCALC 137 (H) 06/11/2020   CREATININE 0.65 (L) 02/28/2020   Lab Results  Component Value Date   INSULIN 30.8 (H) 06/11/2020   INSULIN 80.6 (H) 02/28/2020   Plan: Refill Metformin for 1 month, as per below. Consider changing to Rybelsus in the future.  Refill- metFORMIN (GLUCOPHAGE) 1000 MG tablet; Take 1 tablet (1,000 mg total) by mouth 2 (two) times daily with a meal.  Dispense: 60 tablet; Refill: 0  2. Vitamin D deficiency James Gross's Vitamin D level was 20.6 on 06/11/2020. He is currently taking prescription vitamin D 50,000 IU each week. He denies nausea, vomiting or muscle weakness.   Ref. Range 06/11/2020 14:53  Vitamin D, 25-Hydroxy Latest Ref Range: 30.0 - 100.0 ng/mL 20.6 (L)   Plan: Refill Vit D for 1 month, as per below. Low Vitamin D level contributes to fatigue and are associated with obesity, breast, and colon cancer. He agrees to continue to take prescription Vitamin D @50 ,000 IU every week and will follow-up for routine testing of Vitamin D, at least 2-3 times per year to avoid over-replacement.  Refill- Vitamin D, Ergocalciferol, (DRISDOL) 1.25 MG (50000 UNIT) CAPS capsule; 1 po q wed and 1 po q sun  Dispense: 8 capsule; Refill: 0  3. Class 3 severe obesity with serious comorbidity and body mass index (BMI) of 60.0 to 69.9 in adult,  unspecified obesity type (HCC) James Gross is currently in the action stage of change. As such, his goal is to continue with weight loss efforts. He has agreed to keeping a food journal and adhering to recommended goals of 2600-2700 calories and 140 g protein.   Exercise goals: As is  Behavioral modification strategies: increasing lean protein intake, decreasing simple carbohydrates, increasing water intake, meal planning and cooking strategies, keeping healthy foods in the home and planning for success.  James Gross has agreed to follow-up with our clinic in 2 weeks. He was informed of the importance of frequent follow-up  visits to maximize his success with intensive lifestyle modifications for his multiple health conditions.   Objective:   Blood pressure 117/71, pulse 64, temperature 98.6 F (37 C), height 5\' 8"  (1.727 m), weight (!) 414 lb (187.8 kg), SpO2 99 %. Body mass index is 62.95 kg/m.  General: Cooperative, alert, well developed, in no acute distress. HEENT: Conjunctivae and lids unremarkable. Cardiovascular: Regular rhythm.  Lungs: Normal work of breathing. Neurologic: No focal deficits.   Lab Results  Component Value Date   CREATININE 0.65 (L) 02/28/2020   BUN 10 02/28/2020   NA 141 02/28/2020   K 4.4 02/28/2020   CL 101 02/28/2020   CO2 26 02/28/2020   Lab Results  Component Value Date   ALT 65 (H) 06/11/2020   AST 26 02/28/2020   ALKPHOS 96 02/28/2020   BILITOT 0.3 02/28/2020   Lab Results  Component Value Date   HGBA1C 6.0 (H) 06/11/2020   HGBA1C 6.6 (H) 02/28/2020   HGBA1C 5.9 07/27/2012   Lab Results  Component Value Date   INSULIN 30.8 (H) 06/11/2020   INSULIN 80.6 (H) 02/28/2020   Lab Results  Component Value Date   TSH 2.690 02/28/2020   Lab Results  Component Value Date   CHOL 193 06/11/2020   HDL 41 06/11/2020   LDLCALC 137 (H) 06/11/2020   TRIG 84 06/11/2020   CHOLHDL 4.7 06/11/2020   Lab Results  Component Value Date   WBC 3.9 02/28/2020   HGB 13.1 02/28/2020   HCT 42.2 02/28/2020   MCV 81 02/28/2020   PLT 322 02/28/2020   No results found for: IRON, TIBC, FERRITIN  Attestation Statements:   Reviewed by clinician on day of visit: allergies, medications, problem list, medical history, surgical history, family history, social history, and previous encounter notes.  03/01/2020, am acting as Edmund Hilda for Energy manager, DO.  I have reviewed the above documentation for accuracy and completeness, and I agree with the above. [employer], D.O.  The 21st Century Cures Act was signed into law in 2016 which includes the  topic of electronic health records.  This provides immediate access to information in MyChart.  This includes consultation notes, operative notes, office notes, lab results and pathology reports.  If you have any questions about what you read please let 2017 know at your next visit so we can discuss your concerns and take corrective action if need be.  We are right here with you.

## 2020-08-20 ENCOUNTER — Encounter (INDEPENDENT_AMBULATORY_CARE_PROVIDER_SITE_OTHER): Payer: Self-pay | Admitting: Family Medicine

## 2020-08-20 ENCOUNTER — Telehealth (INDEPENDENT_AMBULATORY_CARE_PROVIDER_SITE_OTHER): Payer: Medicaid Other | Admitting: Family Medicine

## 2020-08-20 ENCOUNTER — Other Ambulatory Visit: Payer: Self-pay

## 2020-08-20 VITALS — BP 110/76 | HR 65 | Temp 98.2°F | Ht 68.0 in | Wt >= 6400 oz

## 2020-08-20 DIAGNOSIS — Z6841 Body Mass Index (BMI) 40.0 and over, adult: Secondary | ICD-10-CM

## 2020-08-20 DIAGNOSIS — G4733 Obstructive sleep apnea (adult) (pediatric): Secondary | ICD-10-CM | POA: Diagnosis not present

## 2020-08-20 DIAGNOSIS — E559 Vitamin D deficiency, unspecified: Secondary | ICD-10-CM

## 2020-08-20 DIAGNOSIS — E1169 Type 2 diabetes mellitus with other specified complication: Secondary | ICD-10-CM

## 2020-08-20 DIAGNOSIS — Z9989 Dependence on other enabling machines and devices: Secondary | ICD-10-CM

## 2020-08-27 NOTE — Progress Notes (Addendum)
TeleHealth Visit:  Due to the COVID-19 pandemic, this visit was completed with telemedicine (audio/video) technology to reduce patient and provider exposure as well as to preserve personal protective equipment.   Json has verbally consented to this TeleHealth visit. The patient is located at home, the provider is located at the [facility] office. The participants in this visit include the listed provider and patient and. The visit was conducted today via MyChart.   I attempted a video visit and James Gross was unable to use realtime visual technology today; thus the telehealth visit was conducted via telephone for approximately  OBESITY James Gross is here to discuss his progress with his obesity treatment plan along with follow-up of his obesity related diagnoses.   Today's visit was #: 12 Starting weight: 471 lbs Starting date: 02/28/2020 Today's date: 08/20/2020  Interim History:  James Gross is here for a follow up office visit.  he is following the meal plan without concern or issues.  Patient's meal and food recall appears to be accurate and consistent with what is on the plan.  When on plan, his hunger and cravings are well controlled.    Nutrition Plan: keeping a food journal and adhering to recommended goals of 2600-2700 calories and 140 grams of protein for 80-90% of the time. Activity: 10,000 steps per day, exercise videos, playting basketball for 30-60 minutes 5-7 times per week.  Assessment/Plan:   1. OSA on CPAP James Gross is using his CPAP every night.  Sleeping well, getting 7-9 hours per night.  He sees James Gross of Pulmonology later this month for his OSA.  Plan:  Stable.  Symptoms well controlled.  Treatment plan per Sleep Medicine.  2. Type 2 diabetes mellitus with other specified complication, without long-term current use of insulin (HCC) Diabetes Mellitus: Not at goal. Medication: metformin 1,000 mg twice daily. He says he has been more consistent with  taking metformin twice daily.  He is not checking his blood sugars and does not want to.  Denies issues with medications.  Denies concerns with blood sugars.  A1c is 6.0.  Plan:  Continue metformin at current dose.  A1c up to date.  Continue prudent nutritional plan and weight loss.  Lab Results  Component Value Date   HGBA1C 6.0 (H) 06/11/2020   HGBA1C 6.6 (H) 02/28/2020   HGBA1C 5.9 07/27/2012   Lab Results  Component Value Date   LDLCALC 137 (H) 06/11/2020   CREATININE 0.65 (L) 02/28/2020   3. Vitamin D deficiency Not at goal. Current vitamin D is 20.6, tested on 06/11/2020. Optimal goal > 50 ng/dL.   Plan: Continue to take prescription Vitamin D @50 ,000 IU every week as prescribed.  Follow-up for routine testing of Vitamin D, at least 2-3 times per year to avoid over-replacement.  He denies the need for a refill.  4. Class 3 severe obesity with serious comorbidity and body mass index (BMI) of 60.0 to 69.9 in adult, unspecified obesity type (HCC)  Course: Dolan is currently in the action stage of change. As such, his goal is to continue with weight loss efforts.   Nutrition goals: He has agreed to keeping a food journal and adhering to recommended goals of 2600-2700 calories and 140 grams of protein.   Exercise goals: As is.  Behavioral modification strategies: increasing water intake, meal planning and cooking strategies and better snacking choices.  James Gross has agreed to follow-up with our clinic in 2-3 weeks. He was informed of the importance  of frequent follow-up visits to maximize his success with intensive lifestyle modifications for his multiple health conditions.   Objective:   Blood pressure 110/76, pulse 65, temperature 98.2 F (36.8 C), height 5\' 8"  (1.727 m), weight (!) 414 lb (187.8 kg), SpO2 99 %. Body mass index is 62.95 kg/m.  General: Cooperative, alert, well developed, in no acute distress. HEENT: Conjunctivae and lids unremarkable. Cardiovascular: Regular  rhythm.  Lungs: Normal work of breathing. Neurologic: No focal deficits.   Lab Results  Component Value Date   CREATININE 0.65 (L) 02/28/2020   BUN 10 02/28/2020   NA 141 02/28/2020   K 4.4 02/28/2020   CL 101 02/28/2020   CO2 26 02/28/2020   Lab Results  Component Value Date   ALT 65 (H) 06/11/2020   AST 26 02/28/2020   ALKPHOS 96 02/28/2020   BILITOT 0.3 02/28/2020   Lab Results  Component Value Date   HGBA1C 6.0 (H) 06/11/2020   HGBA1C 6.6 (H) 02/28/2020   HGBA1C 5.9 07/27/2012   Lab Results  Component Value Date   INSULIN 30.8 (H) 06/11/2020   INSULIN 80.6 (H) 02/28/2020   Lab Results  Component Value Date   TSH 2.690 02/28/2020   Lab Results  Component Value Date   CHOL 193 06/11/2020   HDL 41 06/11/2020   LDLCALC 137 (H) 06/11/2020   TRIG 84 06/11/2020   CHOLHDL 4.7 06/11/2020   Lab Results  Component Value Date   WBC 3.9 02/28/2020   HGB 13.1 02/28/2020   HCT 42.2 02/28/2020   MCV 81 02/28/2020   PLT 322 02/28/2020   Attestation Statements:   Reviewed by clinician on day of visit: allergies, medications, problem list, medical history, surgical history, family history, social history, and previous encounter notes.  I, 03/01/2020, CMA, am acting as Insurance claims handler for Energy manager, DO.  I have reviewed the above documentation for accuracy and completeness, and I agree with the above. [employer], D.O.  The 21st Century Cures Act was signed into law in 2016 which includes the topic of electronic health records.  This provides immediate access to information in MyChart.  This includes consultation notes, operative notes, office notes, lab results and pathology reports.  If you have any questions about what you read please let 2017 know at your next visit so we can discuss your concerns and take corrective action if need be.  We are right here with you.

## 2020-08-30 ENCOUNTER — Encounter (HOSPITAL_COMMUNITY): Payer: Self-pay

## 2020-08-30 ENCOUNTER — Emergency Department (HOSPITAL_COMMUNITY)
Admission: EM | Admit: 2020-08-30 | Discharge: 2020-08-30 | Disposition: A | Discharge status: Home / Self Care | Payer: Medicaid Other | Attending: Emergency Medicine | Admitting: Emergency Medicine

## 2020-08-30 ENCOUNTER — Other Ambulatory Visit: Payer: Self-pay

## 2020-08-30 DIAGNOSIS — M79605 Pain in left leg: Secondary | ICD-10-CM | POA: Diagnosis present

## 2020-08-30 DIAGNOSIS — E119 Type 2 diabetes mellitus without complications: Secondary | ICD-10-CM | POA: Insufficient documentation

## 2020-08-30 DIAGNOSIS — Z7984 Long term (current) use of oral hypoglycemic drugs: Secondary | ICD-10-CM | POA: Insufficient documentation

## 2020-08-30 DIAGNOSIS — E038 Other specified hypothyroidism: Secondary | ICD-10-CM | POA: Diagnosis not present

## 2020-08-30 MED ORDER — METHOCARBAMOL 500 MG PO TABS: 500.0000 mg | ORAL_TABLET | Freq: Two times a day (BID) | ORAL | 0 refills | Status: DC

## 2020-08-30 MED ORDER — METHOCARBAMOL 500 MG PO TABS
500.0000 mg | ORAL_TABLET | Freq: Once | ORAL | Status: AC
  Administered 2020-08-30: 500 mg via ORAL
  Filled 2020-08-30: qty 1

## 2020-08-30 MED ORDER — KETOROLAC TROMETHAMINE 60 MG/2ML IM SOLN
60.0000 mg | Freq: Once | INTRAMUSCULAR | Status: AC
  Administered 2020-08-30: 60 mg via INTRAMUSCULAR
  Filled 2020-08-30: qty 2

## 2020-08-30 NOTE — ED Provider Notes (Signed)
[facility] EMERGENCY DEPARTMENT Provider Note   CSN: [id number] Arrival date & time: 08/30/20  0441     History Chief Complaint  Patient presents with  . Leg Pain    James Gross is a 20 y.o. male.  The history is provided by the patient and medical records.  Leg Pain   19 y.o. M with hx of morbid obesity, presenting to the ED for left leg pain.  Patient was standing at sink washing dishes, bent down to put a bowl away in a lower cabinet when he felt a "cramp" sitting in his left thigh.  States he has had ongoing pain since then.  He tried taking Motrin at home without relief.  States he feels like there is some "spasm".  States he does not feel that he can walk on his leg.  He denies any bony type pain.  Mother also put some ice on the leg without change.  Past Medical History:  Diagnosis Date  . Morbid obesity (HCC)   . Obesity   . OSA on CPAP   . Prediabetes     Patient Active Problem List   Diagnosis Date Noted  . NAFLD (nonalcoholic fatty liver disease) 43/88/8757  . At risk for heart disease 06/25/2020  . Hyperlipidemia associated with type 2 diabetes mellitus (HCC) 05/28/2020  . Diabetes mellitus (HCC) 05/15/2020  . OSA on CPAP 05/15/2020  . Vitamin D deficiency 05/15/2020  . Nonalcoholic hepatosteatosis 05/15/2020  . Other specified hypothyroidism 02/28/2020  . SOB (shortness of breath) on exertion 02/28/2020  . Peripheral edema 02/28/2020  . Pre-hypertension 02/28/2020  . Obstructive sleep apnea 03/27/2019  . Fatigue 03/27/2019  . Appendicitis, acute 08/11/2016  . Class 3 severe obesity with serious comorbidity and body mass index (BMI) greater than or equal to 70 in adult (HCC) 04/29/2016  . Insulin resistance 04/29/2016  . Acanthosis 04/29/2016  . Elevated hemoglobin A1c 04/29/2016  . Prediabetes     Past Surgical History:  Procedure Laterality Date  . LAPAROSCOPIC APPENDECTOMY N/A 08/11/2016   Procedure: APPENDECTOMY LAPAROSCOPIC;   Surgeon: Leonia Corona, MD;  Location: [facility] OR;  Service: General;  Laterality: N/A;  . MYRINGOTOMY         Family History  Problem Relation Age of Onset  . Hypertension Father   . Hyperlipidemia Father   . Diabetes Father   . Kidney disease Father   . Eating disorder Father   . Obesity Father   . Hypertension Paternal Grandmother   . Diabetes Paternal Grandmother   . Obesity Paternal Grandmother   . Obesity Mother   . Obesity Maternal Grandmother   . Obesity Paternal Grandfather     Social History   Tobacco Use  . Smoking status: Never Smoker  . Smokeless tobacco: Never Used  Substance Use Topics  . Alcohol use: No  . Drug use: No    Home Medications Prior to Admission medications   Medication Sig Start Date End Date Taking? Authorizing Provider  furosemide (LASIX) 40 MG tablet Take 40 mg by mouth daily.  12/22/19   [provider]  metFORMIN (GLUCOPHAGE) 1000 MG tablet Take 1 tablet (1,000 mg total) by mouth 2 (two) times daily with a meal. 08/06/20   Opalski, Deborah, DO  Potassium Chloride ER 20 MEQ TBCR Take 1 tablet by mouth daily. 12/21/19   [provider]  Vitamin D, Ergocalciferol, (DRISDOL) 1.25 MG (50000 UNIT) CAPS capsule 1 po q wed and 1 po q sun 08/06/20  Thomasene Lot, DO    Allergies    Patient has no known allergies.  Review of Systems   Review of Systems  Musculoskeletal: Positive for myalgias.  All other systems reviewed and are negative.   Physical Exam Updated Vital Signs BP (!) 155/87 (BP Location: Left Arm)   Pulse 79   Temp 98.2 F (36.8 C) (Oral)   Resp 16   Ht 5\' 8"  (1.727 m)   Wt (!) 188.2 kg   SpO2 98%   BMI 63.10 kg/m   Physical Exam Vitals and nursing note reviewed.  Constitutional:      Appearance: He is well-developed and well-nourished.     Comments: morbidly obese  HENT:     Head: Normocephalic and atraumatic.     Mouth/Throat:     Mouth: Oropharynx is clear and moist.  Eyes:     Extraocular  Movements: EOM normal.     Conjunctiva/sclera: Conjunctivae normal.     Pupils: Pupils are equal, round, and reactive to light.  Cardiovascular:     Rate and Rhythm: Normal rate and regular rhythm.     Heart sounds: Normal heart sounds.  Pulmonary:     Effort: Pulmonary effort is normal.     Breath sounds: Normal breath sounds.  Abdominal:     General: Bowel sounds are normal.     Palpations: Abdomen is soft.  Musculoskeletal:        General: Normal range of motion.     Cervical back: Normal range of motion.       Legs:     Comments: Exam somewhat limited due to body habitus Point to left anterior upper/lateral thigh as area of pain; there is no muscle bulge or deformity appreciated; no overlying skin changes/bruising, he is able to plantar and dorsiflex against resistance with noted quad muscle contraction, he is able to flex/extend knee No bony tenderness of the hip, no leg shortening Chronic venous stasis changes on the legs  Skin:    General: Skin is warm and dry.  Neurological:     Mental Status: He is alert and oriented to person, place, and time.  Psychiatric:        Mood and Affect: Mood and affect normal.     ED Results / Procedures / Treatments   Labs (all labs ordered are listed, but only abnormal results are displayed) Labs Reviewed - No data to display  EKG None  Radiology No results found.  Procedures Procedures   Medications Ordered in ED Medications  ketorolac (TORADOL) injection 60 mg (60 mg Intramuscular Given 08/30/20 0533)  methocarbamol (ROBAXIN) tablet 500 mg (500 mg Oral Given 08/30/20 0533)    ED Course  I have reviewed the triage vital signs and the nursing notes.  Pertinent labs & imaging results that were available during my care of the patient were reviewed by me and considered in my medical decision making (see chart for details).    MDM Rules/Calculators/A&P  20 year old male presenting to the ED with left leg pain.  Was standing  at sink washing dishes, bent down to put bowl away and lower counter and felt a "pull" in his leg.  He thinks he pulled a muscle.  Pain localized to upper/lateral thigh.  Exam here is somewhat limited due to his body habitus but I do not appreciable any muscle bulge/deformity.  He is able to plantar and dorsiflex against resistance, quads are contracting, able to flex/extend at the knee.  No signs/symptoms concerning for quad tendon  rupture, DVT, bony injury, etc.  Do not feel imaging will be beneficial in this instance.  Given medications to help with spasms/cramping, supportive care measures at home.  Close follow-up with PCP.  Return here for any new/acute changes.  Final Clinical Impression(s) / ED Diagnoses Final diagnoses:  Left leg pain    Rx / DC Orders ED Discharge Orders         Ordered    methocarbamol (ROBAXIN) 500 MG tablet  2 times daily        08/30/20 0540           Garlon Hatchet, PA-C 08/30/20 0547    Gilda Crease, MD 08/30/20 [phone]

## 2020-08-30 NOTE — ED Triage Notes (Signed)
Patient reports L leg pain starting about 0000, states he bent down to put something way and feels like he may have pulled a muscle.

## 2020-08-30 NOTE — Discharge Instructions (Signed)
I have sent medications to your pharmacy to try to help with cramping/spasms. Can continue heat/ice, massage, etc to help as well. This should gradually improve with time.  If not, please follow-up with your doctor. Return here for new concerns.

## 2020-08-31 NOTE — Progress Notes (Deleted)
HPI M never smoker followed for OSA, complicated by Morbid Obesity, PreDiabetes NPSG 03/27/19  AHI 169.9/ hr, desaturation to 66%, Body weight 430 lbs, CPAP titrated to 16  ===================================================================== 05/02/20- 18 yoM never smoker followed for OSA, complicated by Morbid Obesity, DM2,  CPAP auto 12-18/ [employer] Download- compliance 27%, AHI 0.6/ hr     Short nights and missed nights Body weight today- 442 lbs Covid vax- none Flu vax- today standard Working with [facility] on weight loss program. Has lost almost 30 lbs since last visit. He is trying to improve compliance and denies CPAP discomfort other than dry mouth. Discussed Biotene and humidifier adjustment. I gave him a copy of his compliance download and went over it.  09/03/20-19yoM never smoker followed for OSA, complicated by Morbid Obesity, DM2,  CPAP auto 12-18/ [employer] Download- Body weight today- Covid vax- Flu vax-   ROS-see HPI   + = positive Constitutional:    +diet/weight loss, night sweats, fevers, chills, fatigue, lassitude. HEENT:    headaches, difficulty swallowing, tooth/dental problems, sore throat,       sneezing, itching, ear ache, nasal congestion, post nasal drip, snoring CV:    chest pain, orthopnea, PND, swelling in lower extremities, anasarca,                                   dizziness, palpitations Resp:   shortness of breath with exertion or at rest.                productive cough,   non-productive cough, coughing up of blood.              change in color of mucus.  wheezing.   Skin:    rash or lesions. GI:  No-   heartburn, indigestion, abdominal pain, nausea, vomiting, diarrhea,                 change in bowel habits, loss of appetite GU: dysuria, change in color of urine, no urgency or frequency.   flank pain. MS:   joint pain, stiffness, decreased range of motion, back pain. Neuro-     nothing unusual Psych:  change in mood or affect.   depression or anxiety.   memory loss.  OBJ- Physical Exam General- Alert, Oriented, Affect-appropriate, Distress- none acute, + morbid obesity Skin- rash-none, lesions- none, excoriation- none Lymphadenopathy- none Head- atraumatic            Eyes- Gross vision intact, PERRLA, conjunctivae and secretions clear            Ears- Hearing, canals-normal            Nose- Clear, no-Septal dev, mucus, polyps, erosion, perforation             Throat- Mallampati III-IV , mucosa clear , drainage- none, tonsils+, + teeth Neck- flexible , trachea midline, no stridor , thyroid nl, carotid no bruit Chest - symmetrical excursion , unlabored           Heart/CV- RRR , no murmur , no gallop  , no rub, nl s1 s2                           - JVD- none , edema- none, stasis changes- none, varices- none           Lung- clear to P&A, wheeze- none, cough- none , dullness-none,  rub- none           Chest wall-  Abd-  Br/ Gen/ Rectal- Not done, not indicated Extrem- cyanosis- none, clubbing, none, atrophy- none, strength- nl Neuro- grossly intact to observation

## 2020-09-03 ENCOUNTER — Ambulatory Visit: Payer: Medicaid Other | Admitting: Internal Medicine

## 2020-09-05 ENCOUNTER — Telehealth (INDEPENDENT_AMBULATORY_CARE_PROVIDER_SITE_OTHER): Payer: Medicaid Other | Admitting: Family Medicine

## 2020-09-05 ENCOUNTER — Other Ambulatory Visit: Payer: Self-pay

## 2020-09-05 ENCOUNTER — Encounter (INDEPENDENT_AMBULATORY_CARE_PROVIDER_SITE_OTHER): Payer: Self-pay | Admitting: Family Medicine

## 2020-09-05 DIAGNOSIS — Z6841 Body Mass Index (BMI) 40.0 and over, adult: Secondary | ICD-10-CM

## 2020-09-05 DIAGNOSIS — E559 Vitamin D deficiency, unspecified: Secondary | ICD-10-CM | POA: Diagnosis not present

## 2020-09-05 DIAGNOSIS — E1169 Type 2 diabetes mellitus with other specified complication: Secondary | ICD-10-CM

## 2020-09-05 MED ORDER — VITAMIN D (ERGOCALCIFEROL) 1.25 MG (50000 UNIT) PO CAPS: ORAL_CAPSULE | ORAL | 0 refills | Status: DC

## 2020-09-05 MED ORDER — METFORMIN HCL 1000 MG PO TABS: 1000.0000 mg | ORAL_TABLET | Freq: Two times a day (BID) | ORAL | 0 refills | Status: DC

## 2020-09-06 NOTE — Progress Notes (Signed)
TeleHealth Visit:  Due to the COVID-19 pandemic, this visit was completed with telemedicine (audio/video) technology to reduce patient and provider exposure as well as to preserve personal protective equipment.   James Gross has verbally consented to this TeleHealth visit. The patient is located at home, the provider is located at the [facility] office. The participants in this visit include the listed provider and patient and. The visit was conducted today via MyChart.  OBESITY James Gross is here to discuss his progress with his obesity treatment plan along with follow-up of his obesity related diagnoses.   Today's visit was #: 13 Starting weight: 471 lbs Starting date: 02/28/2020 Today's date: 09/05/2020  Interim History:  James Gross says he injured his leg and has decreased appetite due to pain.  Getting in around 1800 calories per day and 90-110 grams of protein per day.  Denies issues with the plan.  Hates not being able to get up and walk, but will increase activity over the next couple of days.  ED notes reviewed from 08/30/2020.  Current Meal Plan: keeping a food journal and adhering to recommended goals of 2600-2700 calories and 140 grams of protein for 90% of the time.  Current Exercise Plan: None.  Assessment/Plan:   1. Vitamin D deficiency Not at goal. Current vitamin D is 20.6, tested on 06/11/2020. Optimal goal > 50 ng/dL.  He is taking vitamin D 50,000 IU daily.  Plan: Continue to take prescription Vitamin D @50 ,000 IU every week as prescribed.  Follow-up for routine testing of Vitamin D, at least 2-3 times per year to avoid over-replacement.  - Refill Vitamin D, Ergocalciferol, (DRISDOL) 1.25 MG (50000 UNIT) CAPS capsule; 1 po q wed and 1 po q sun  Dispense: 8 capsule; Refill: 0  2. Type 2 diabetes mellitus with other specified complication, without long-term current use of insulin (HCC) Diabetes Mellitus: Not at goal. Medication: metformin 1,000 mg twice daily. Issues  reviewed: blood sugar goals, complications of diabetes mellitus, hypoglycemia prevention and treatment, exercise, and nutrition.  Denies symptoms of hypoglycemia or any side effects of medication.  Tolerating well.  Does not own a glucometer and does not want to check blood sugars.  Plan:  Resume meal plan in the near future to full amount of calories and protein per day.  The importance of regular follow up with PCP and all other specialists as scheduled was stressed to patient today.  Lab Results  Component Value Date   HGBA1C 6.0 (H) 06/11/2020   HGBA1C 6.6 (H) 02/28/2020   HGBA1C 5.9 07/27/2012   Lab Results  Component Value Date   LDLCALC 137 (H) 06/11/2020   CREATININE 0.65 (L) 02/28/2020   - Refill metFORMIN (GLUCOPHAGE) 1000 MG tablet; Take 1 tablet (1,000 mg total) by mouth 2 (two) times daily with a meal.  Dispense: 60 tablet; Refill: 0  3. Class 3 severe obesity with serious comorbidity and body mass index (BMI) of 60.0 to 69.9 in adult, unspecified obesity type (HCC)  Course: James Gross is currently in the action stage of change. As such, his goal is to continue with weight loss efforts.   Nutrition goals: He has agreed to keeping a food journal and adhering to recommended goals of 2600-2700 calories and 140 grams of protein.   Exercise goals: Per PCP, who is treating his acute injury at this time.  Behavioral modification strategies: keeping healthy foods in the home, avoiding temptations and planning for success.  James Gross has agreed to follow-up with  our clinic in 2 weeks. He was informed of the importance of frequent follow-up visits to maximize his success with intensive lifestyle modifications for his multiple health conditions.   Objective:   There were no vitals taken for this visit. There is no height or weight on file to calculate BMI.  General: Cooperative, alert, well developed, in no acute distress. HEENT: Conjunctivae and lids unremarkable. Cardiovascular: Regular  rhythm.  Lungs: Normal work of breathing. Neurologic: No focal deficits.   Lab Results  Component Value Date   CREATININE 0.65 (L) 02/28/2020   BUN 10 02/28/2020   NA 141 02/28/2020   K 4.4 02/28/2020   CL 101 02/28/2020   CO2 26 02/28/2020   Lab Results  Component Value Date   ALT 65 (H) 06/11/2020   AST 26 02/28/2020   ALKPHOS 96 02/28/2020   BILITOT 0.3 02/28/2020   Lab Results  Component Value Date   HGBA1C 6.0 (H) 06/11/2020   HGBA1C 6.6 (H) 02/28/2020   HGBA1C 5.9 07/27/2012   Lab Results  Component Value Date   INSULIN 30.8 (H) 06/11/2020   INSULIN 80.6 (H) 02/28/2020   Lab Results  Component Value Date   TSH 2.690 02/28/2020   Lab Results  Component Value Date   CHOL 193 06/11/2020   HDL 41 06/11/2020   LDLCALC 137 (H) 06/11/2020   TRIG 84 06/11/2020   CHOLHDL 4.7 06/11/2020   Lab Results  Component Value Date   WBC 3.9 02/28/2020   HGB 13.1 02/28/2020   HCT 42.2 02/28/2020   MCV 81 02/28/2020   PLT 322 02/28/2020   Attestation Statements:   Reviewed by clinician on day of visit: allergies, medications, problem list, medical history, surgical history, family history, social history, and previous encounter notes.  I, Insurance claims handler, CMA, am acting as Energy manager for [employer], DO.  I have reviewed the above documentation for accuracy and completeness, and I agree with the above. Carlye Grippe, D.O.  The 21st Century Cures Act was signed into law in 2016 which includes the topic of electronic health records.  This provides immediate access to information in MyChart.  This includes consultation notes, operative notes, office notes, lab results and pathology reports.  If you have any questions about what you read please let us know at your next visit so we can discuss your concerns and take corrective action if need be.  We are right here with you.

## 2020-09-17 ENCOUNTER — Encounter (INDEPENDENT_AMBULATORY_CARE_PROVIDER_SITE_OTHER): Payer: Self-pay | Admitting: Family Medicine

## 2020-09-17 ENCOUNTER — Other Ambulatory Visit: Payer: Self-pay

## 2020-09-17 ENCOUNTER — Ambulatory Visit (INDEPENDENT_AMBULATORY_CARE_PROVIDER_SITE_OTHER): Payer: Medicaid Other | Admitting: Family Medicine

## 2020-09-17 VITALS — BP 110/67 | HR 91 | Temp 98.5°F | Ht 68.0 in | Wt >= 6400 oz

## 2020-09-17 DIAGNOSIS — E1169 Type 2 diabetes mellitus with other specified complication: Secondary | ICD-10-CM | POA: Diagnosis not present

## 2020-09-17 DIAGNOSIS — Z6841 Body Mass Index (BMI) 40.0 and over, adult: Secondary | ICD-10-CM

## 2020-09-17 DIAGNOSIS — E559 Vitamin D deficiency, unspecified: Secondary | ICD-10-CM

## 2020-09-17 DIAGNOSIS — K760 Fatty (change of) liver, not elsewhere classified: Secondary | ICD-10-CM

## 2020-09-17 DIAGNOSIS — E785 Hyperlipidemia, unspecified: Secondary | ICD-10-CM

## 2020-09-17 MED ORDER — VITAMIN D (ERGOCALCIFEROL) 1.25 MG (50000 UNIT) PO CAPS: ORAL_CAPSULE | ORAL | 0 refills | Status: DC

## 2020-09-17 MED ORDER — METFORMIN HCL 1000 MG PO TABS: 1000.0000 mg | ORAL_TABLET | Freq: Two times a day (BID) | ORAL | 0 refills | Status: DC

## 2020-09-24 NOTE — Progress Notes (Signed)
Chief Complaint:   OBESITY James Gross is here to discuss his progress with his obesity treatment plan along with follow-up of his obesity related diagnoses.   Today's visit was #: 14 Starting weight: 471 lbs Starting date: 02/28/2020 Today's weight: 408 lbs Today's date: 09/17/2020 Total lbs lost to date: 63 lbs Body mass index is 62.04 kg/m.  Total weight loss percentage to date: -13.38%  Interim History:  James Gross recently got back to 2400-2700 calories per day and 120-140 grams of protein per day.  Plan:  Will check labs again in 3 weeks.  Current Meal Plan: keeping a food journal and adhering to recommended goals of 2600-2700 calories and 140 grams of protein for 60% of the time.  Current Exercise Plan: None due to a groin injury.  Assessment/Plan:   1. Type 2 diabetes mellitus with other specified complication, without long-term current use of insulin (HCC) Diabetes Mellitus: Not at goal. Medication: metformin 1,000 mg twice daily. Issues reviewed: blood sugar goals, complications of diabetes mellitus, hypoglycemia prevention and treatment, exercise, and nutrition.  He is tolerating the increase in metformin well since the end of December/early January.  Taking twice daily more regularly now.  Plan: The importance of regular follow up with PCP and all other specialists as scheduled was stressed to patient today.  Will check A1c and FI at next visit.  Lab Results  Component Value Date   HGBA1C 6.0 (H) 06/11/2020   HGBA1C 6.6 (H) 02/28/2020   HGBA1C 5.9 07/27/2012   Lab Results  Component Value Date   LDLCALC 137 (H) 06/11/2020   CREATININE 0.65 (L) 02/28/2020   - Refill metFORMIN (GLUCOPHAGE) 1000 MG tablet; Take 1 tablet (1,000 mg total) by mouth 2 (two) times daily with a meal.  Dispense: 60 tablet; Refill: 0  2. Nonalcoholic hepatosteatosis He has been cutting back on fatty meats since his last visit.  ALT is elevated.  Will check CMP at next office visit.  NAFLD is an  umbrella term that encompasses a disease spectrum that includes steatosis (fat) without inflammation, steatohepatitis (NASH; fat + inflammation in a characteristic pattern), and cirrhosis. Bland steatosis is felt to be a benign condition, with extremely low to no risk of progression to cirrhosis, whereas NASH can progress to cirrhosis. The mainstay of treatment of NAFLD includes lifestyle modification to achieve weight loss, at least 7% of current body weight. Low carbohydrate diets can be beneficial in improving NAFLD liver histology. Additionally, exercise, even the absence of weight loss can have beneficial effects on the patient's metabolic profile and liver health.   3. Hyperlipidemia associated with type 2 diabetes mellitus (HCC) Course: Improving, but not optimized. Lipid-lowering medications: None.  Elevated LDL at last office visit of 137.    Plan: Dietary changes: Increase soluble fiber, decrease simple carbohydrates, decrease saturated fat. Exercise changes: Moderate to vigorous-intensity aerobic activity 150 minutes per week or as tolerated. We will continue to monitor along with PCP/specialists as it pertains to his weight loss journey.  Will check labs at next visit.  Lab Results  Component Value Date   CHOL 193 06/11/2020   HDL 41 06/11/2020   LDLCALC 137 (H) 06/11/2020   TRIG 84 06/11/2020   CHOLHDL 4.7 06/11/2020   Lab Results  Component Value Date   ALT 65 (H) 06/11/2020   AST 26 02/28/2020   ALKPHOS 96 02/28/2020   BILITOT 0.3 02/28/2020   4. Vitamin D deficiency Not at goal. Current vitamin D is 20.6, tested on  06/11/2020. Optimal goal > 50 ng/dL.  She is taking vitamin D 50,000 IU twice weekly.   Plan: Continue to take prescription Vitamin D @50 ,000 IU twice weekly as prescribed.  Will check vitamin D level at next visit.  - Refill Vitamin D, Ergocalciferol, (DRISDOL) 1.25 MG (50000 UNIT) CAPS capsule; 1 po q wed and 1 po q sun  Dispense: 8 capsule; Refill: 0  5.  Class 3 severe obesity with serious comorbidity and body mass index (BMI) of 60.0 to 69.9 in adult, unspecified obesity type (HCC)  Course: James Gross is currently in the action stage of change. As such, his goal is to continue with weight loss efforts.   Nutrition goals: He has agreed to keeping a food journal and adhering to recommended goals of 2600-2700 calories and 140 grams of protein.   Exercise goals: As tolerated and directed by PCP.  Behavioral modification strategies: increasing lean protein intake, decreasing simple carbohydrates, meal planning and cooking strategies and planning for success.  James Gross has agreed to follow-up with our clinic in 3 weeks for fasting blood work. He was informed of the importance of frequent follow-up visits to maximize his success with intensive lifestyle modifications for his multiple health conditions.   Objective:   Blood pressure 110/67, pulse 91, temperature 98.5 F (36.9 C), height 5\' 8"  (1.727 m), weight (!) 408 lb (185.1 kg), SpO2 98 %. Body mass index is 62.04 kg/m.  General: Cooperative, alert, well developed, in no acute distress. HEENT: Conjunctivae and lids unremarkable. Cardiovascular: Regular rhythm.  Lungs: Normal work of breathing. Neurologic: No focal deficits.   Lab Results  Component Value Date   CREATININE 0.65 (L) 02/28/2020   BUN 10 02/28/2020   NA 141 02/28/2020   K 4.4 02/28/2020   CL 101 02/28/2020   CO2 26 02/28/2020   Lab Results  Component Value Date   ALT 65 (H) 06/11/2020   AST 26 02/28/2020   ALKPHOS 96 02/28/2020   BILITOT 0.3 02/28/2020   Lab Results  Component Value Date   HGBA1C 6.0 (H) 06/11/2020   HGBA1C 6.6 (H) 02/28/2020   HGBA1C 5.9 07/27/2012   Lab Results  Component Value Date   INSULIN 30.8 (H) 06/11/2020   INSULIN 80.6 (H) 02/28/2020   Lab Results  Component Value Date   TSH 2.690 02/28/2020   Lab Results  Component Value Date   CHOL 193 06/11/2020   HDL 41 06/11/2020   LDLCALC  137 (H) 06/11/2020   TRIG 84 06/11/2020   CHOLHDL 4.7 06/11/2020   Lab Results  Component Value Date   WBC 3.9 02/28/2020   HGB 13.1 02/28/2020   HCT 42.2 02/28/2020   MCV 81 02/28/2020   PLT 322 02/28/2020   Attestation Statements:   Reviewed by clinician on day of visit: allergies, medications, problem list, medical history, surgical history, family history, social history, and previous encounter notes.  I, 03/01/2020, CMA, am acting as 03/01/2020 for Insurance claims handler, DO.  I have reviewed the above documentation for accuracy and completeness, and I agree with the above. Energy manager, D.O.  The 21st Century Cures Act was signed into law in 2016 which includes the topic of electronic health records.  This provides immediate access to information in MyChart.  This includes consultation notes, operative notes, office notes, lab results and pathology reports.  If you have any questions about what you read please let Carlye Grippe know at your next visit so we can discuss your concerns and take  corrective action if need be.  We are right here with you.

## 2020-10-08 ENCOUNTER — Ambulatory Visit (INDEPENDENT_AMBULATORY_CARE_PROVIDER_SITE_OTHER): Payer: Medicaid Other | Admitting: Family Medicine

## 2020-10-10 ENCOUNTER — Ambulatory Visit (INDEPENDENT_AMBULATORY_CARE_PROVIDER_SITE_OTHER): Payer: Medicaid Other | Admitting: Family Medicine

## 2020-10-10 ENCOUNTER — Other Ambulatory Visit: Payer: Self-pay

## 2020-10-10 ENCOUNTER — Encounter (INDEPENDENT_AMBULATORY_CARE_PROVIDER_SITE_OTHER): Payer: Self-pay | Admitting: Family Medicine

## 2020-10-10 VITALS — BP 104/65 | HR 104 | Temp 98.7°F | Ht 68.0 in | Wt >= 6400 oz

## 2020-10-10 DIAGNOSIS — E559 Vitamin D deficiency, unspecified: Secondary | ICD-10-CM | POA: Diagnosis not present

## 2020-10-10 DIAGNOSIS — E7849 Other hyperlipidemia: Secondary | ICD-10-CM

## 2020-10-10 DIAGNOSIS — E66813 Obesity, class 3: Secondary | ICD-10-CM

## 2020-10-10 DIAGNOSIS — E1169 Type 2 diabetes mellitus with other specified complication: Secondary | ICD-10-CM | POA: Diagnosis not present

## 2020-10-10 DIAGNOSIS — Z6841 Body Mass Index (BMI) 40.0 and over, adult: Secondary | ICD-10-CM

## 2020-10-10 MED ORDER — VITAMIN D (ERGOCALCIFEROL) 1.25 MG (50000 UNIT) PO CAPS: ORAL_CAPSULE | ORAL | 0 refills | Status: DC

## 2020-10-10 MED ORDER — METFORMIN HCL 1000 MG PO TABS: 1000.0000 mg | ORAL_TABLET | Freq: Two times a day (BID) | ORAL | 0 refills | Status: DC

## 2020-10-14 NOTE — Progress Notes (Signed)
HPI M never smoker followed for OSA, complicated by Morbid Obesity, PreDiabetes NPSG 03/27/19  AHI 169.9/ hr, desaturation to 66%, Body weight 430 lbs, CPAP titrated to 16  =====================================================================   05/02/20- 18 yoM never smoker followed for OSA, complicated by Morbid Obesity, DM2,  CPAP auto 12-18/ [employer] Download- compliance 27%, AHI 0.6/ hr     Short nights and missed nights Body weight today- 442 lbs Covid vax- none Flu vax- today standard Working with [facility] on weight loss program. Has lost almost 30 lbs since last visit. He is trying to improve compliance and denies CPAP discomfort other than dry mouth. Discussed Biotene and humidifier adjustment. I gave him a copy of his compliance download and went over it.  10/15/20- 19 yoM never smoker followed for OSA, complicated by Morbid Obesity, DM2,  CPAP auto 12-18/ [employer] Download- compliance 27%, AHI 1.2/ hr Body weight today- 408lbs (down 34) Covid vax-2 Moderna Flu vax-had -----No current respiratory concerns.  Says he falls asleep before putting mask on. Discussed need for regular, careful use of CPAP. Comfortable with FFM.   ROS-see HPI   + = positive Constitutional:    +diet/weight loss, night sweats, fevers, chills, fatigue, lassitude. HEENT:    headaches, difficulty swallowing, tooth/dental problems, sore throat,       sneezing, itching, ear ache, nasal congestion, post nasal drip, snoring CV:    chest pain, orthopnea, PND, swelling in lower extremities, anasarca,                                   dizziness, palpitations Resp:   shortness of breath with exertion or at rest.                productive cough,   non-productive cough, coughing up of blood.              change in color of mucus.  wheezing.   Skin:    rash or lesions. GI:  No-   heartburn, indigestion, abdominal pain, nausea, vomiting, diarrhea,                 change in bowel habits, loss of  appetite GU: dysuria, change in color of urine, no urgency or frequency.   flank pain. MS:   joint pain, stiffness, decreased range of motion, back pain. Neuro-     nothing unusual Psych:  change in mood or affect.  depression or anxiety.   memory loss.  OBJ- Physical Exam General- Alert, Oriented, Affect-appropriate, Distress- none acute, + morbid obesity Skin- rash-none, lesions- none, excoriation- none Lymphadenopathy- none Head- atraumatic            Eyes- Gross vision intact, PERRLA, conjunctivae and secretions clear            Ears- Hearing, canals-normal            Nose- Clear, no-Septal dev, mucus, polyps, erosion, perforation             Throat- Mallampati III-IV , mucosa clear , drainage- none, tonsils+, + teeth Neck- flexible , trachea midline, no stridor , thyroid nl, carotid no bruit Chest - symmetrical excursion , unlabored           Heart/CV- RRR , no murmur , no gallop  , no rub, nl s1 s2                           -  JVD- none , edema- none, stasis changes- none, varices- none           Lung- clear to P&A, wheeze- none, cough- none , dullness-none, rub- none           Chest wall-  Abd-  Br/ Gen/ Rectal- Not done, not indicated Extrem- cyanosis- none, clubbing, none, atrophy- none, strength- nl Neuro- grossly intact to observation

## 2020-10-15 ENCOUNTER — Other Ambulatory Visit: Payer: Self-pay

## 2020-10-15 ENCOUNTER — Ambulatory Visit (INDEPENDENT_AMBULATORY_CARE_PROVIDER_SITE_OTHER): Payer: Medicaid Other | Admitting: Internal Medicine

## 2020-10-15 ENCOUNTER — Encounter: Payer: Self-pay | Admitting: Internal Medicine

## 2020-10-15 DIAGNOSIS — G4733 Obstructive sleep apnea (adult) (pediatric): Secondary | ICD-10-CM

## 2020-10-15 DIAGNOSIS — Z6841 Body Mass Index (BMI) 40.0 and over, adult: Secondary | ICD-10-CM

## 2020-10-15 NOTE — Patient Instructions (Signed)
You are headed in the right direction. Keep working on your weight with diet and exercise  Make a point of putting your CPAP mask on as soon as you get in bed, before you fall asleep. It really helps you when you use it right.   Please call if we can help

## 2020-10-16 NOTE — Progress Notes (Signed)
Chief Complaint:   OBESITY James Gross is here to discuss his progress with his obesity treatment plan along with follow-up of his obesity related diagnoses.   Today's visit was #: 15 Starting weight: 471 lbs Starting date: 02/28/2020 Today's weight: 407 lbs Today's date: 10/10/2020 Total lbs lost to date: 64 lbs Body mass index is 61.88 kg/m.  Total weight loss percentage to date: -13.59%  Interim History:  James Gross is still dealing with this left thigh/quad strain.  Walks to the end of the driveway 2 days per week, thus, still with very limited activity.  He was not able to pull up MFP app today to go over it due to a phone update.  Current Meal Plan: keeping a food journal and adhering to recommended goals of 2600-2700 calories and 140 grams of protein 80% of the time.  Current Exercise Plan: None.  Assessment/Plan:    Medications Discontinued During This Encounter  Medication Reason  . Vitamin D, Ergocalciferol, (DRISDOL) 1.25 MG (50000 UNIT) CAPS capsule Reorder  . metFORMIN (GLUCOPHAGE) 1000 MG tablet Reorder     Meds ordered this encounter  Medications  . metFORMIN (GLUCOPHAGE) 1000 MG tablet    Sig: Take 1 tablet (1,000 mg total) by mouth 2 (two) times daily with a meal.    Dispense:  60 tablet    Refill:  0  . Vitamin D, Ergocalciferol, (DRISDOL) 1.25 MG (50000 UNIT) CAPS capsule    Sig: 1 po q wed and 1 po q sun    Dispense:  8 capsule    Refill:  0     1. Type 2 diabetes mellitus with other specified complication, without long-term current use of insulin (HCC) Diabetes Mellitus: Not at goal. Medication: metformin 1,000 mg twice daily. Issues reviewed: blood sugar goals, complications of diabetes mellitus, hypoglycemia prevention and treatment, exercise, and nutrition.   Plan:  Continue metformin. Will refill today, as per below.  The importance of regular follow up with PCP and all other specialists as scheduled was stressed to patient today.  Will check A1c and  fasting insulin at next office visit.  Lab Results  Component Value Date   HGBA1C 6.0 (H) 06/11/2020   HGBA1C 6.6 (H) 02/28/2020   HGBA1C 5.9 07/27/2012   Lab Results  Component Value Date   LDLCALC 137 (H) 06/11/2020   CREATININE 0.65 (L) 02/28/2020   - Refill metFORMIN (GLUCOPHAGE) 1000 MG tablet; Take 1 tablet (1,000 mg total) by mouth 2 (two) times daily with a meal.  Dispense: 60 tablet; Refill: 0  2. Vitamin D deficiency Not at goal. Current vitamin D is 20.6, tested on 06/11/2020. Optimal goal > 50 ng/dL.  He is taking vitamin D 50,000 IU twice weekly.  Plan: Continue to take prescription Vitamin D @50 ,000 IU twice weekly as prescribed.  Will check vitamin D level at next office visit.  - Refill Vitamin D, Ergocalciferol, (DRISDOL) 1.25 MG (50000 UNIT) CAPS capsule; 1 po q wed and 1 po q sun  Dispense: 8 capsule; Refill: 0  3. Other hyperlipidemia Course: Uncontrolled.  Lipid-lowering medications: None.   Plan: Dietary changes: Increase soluble fiber, decrease simple carbohydrates, decrease saturated fat. Exercise changes: Moderate to vigorous-intensity aerobic activity 150 minutes per week or as tolerated. We will continue to monitor along with PCP/specialists as it pertains to his weight loss journey.  Will check FLP at next office visit. Continue prudent nutritional plan and weight loss, watching saturated/trans fat intake.  Lab Results  Component Value Date  CHOL 193 06/11/2020   HDL 41 06/11/2020   LDLCALC 137 (H) 06/11/2020   TRIG 84 06/11/2020   CHOLHDL 4.7 06/11/2020   Lab Results  Component Value Date   ALT 65 (H) 06/11/2020   AST 26 02/28/2020   ALKPHOS 96 02/28/2020   BILITOT 0.3 02/28/2020   4. Obesity with current BMI of 61.9  Course: James Gross is currently in the action stage of change. As such, his goal is to continue with weight loss efforts.   Nutrition goals: He has agreed to the Category 4 Plan and keeping a food journal and adhering to recommended  goals of 2600-2700 calories and 140 grams of protein.   Exercise goals: Start walking for 5 minutes twice daily.  Increase as tolerated.  Behavioral modification strategies: increasing lean protein intake, decreasing simple carbohydrates, meal planning and cooking strategies and planning for success.  James Gross has agreed to follow-up with our clinic in 3 weeks, fasting. He was informed of the importance of frequent follow-up visits to maximize his success with intensive lifestyle modifications for his multiple health conditions.   Objective:   Blood pressure 104/65, pulse (!) 104, temperature 98.7 F (37.1 C), height 5\' 8"  (1.727 m), weight (!) 407 lb (184.6 kg), SpO2 98 %. Body mass index is 61.88 kg/m.  General: Cooperative, alert, well developed, in no acute distress. HEENT: Conjunctivae and lids unremarkable. Cardiovascular: Regular rhythm.  Lungs: Normal work of breathing. Neurologic: No focal deficits.   Lab Results  Component Value Date   CREATININE 0.65 (L) 02/28/2020   BUN 10 02/28/2020   NA 141 02/28/2020   K 4.4 02/28/2020   CL 101 02/28/2020   CO2 26 02/28/2020   Lab Results  Component Value Date   ALT 65 (H) 06/11/2020   AST 26 02/28/2020   ALKPHOS 96 02/28/2020   BILITOT 0.3 02/28/2020   Lab Results  Component Value Date   HGBA1C 6.0 (H) 06/11/2020   HGBA1C 6.6 (H) 02/28/2020   HGBA1C 5.9 07/27/2012   Lab Results  Component Value Date   INSULIN 30.8 (H) 06/11/2020   INSULIN 80.6 (H) 02/28/2020   Lab Results  Component Value Date   TSH 2.690 02/28/2020   Lab Results  Component Value Date   CHOL 193 06/11/2020   HDL 41 06/11/2020   LDLCALC 137 (H) 06/11/2020   TRIG 84 06/11/2020   CHOLHDL 4.7 06/11/2020   Lab Results  Component Value Date   WBC 3.9 02/28/2020   HGB 13.1 02/28/2020   HCT 42.2 02/28/2020   MCV 81 02/28/2020   PLT 322 02/28/2020   Attestation Statements:   Reviewed by clinician on day of visit: allergies, medications, problem  list, medical history, surgical history, family history, social history, and previous encounter notes.  I, 03/01/2020, CMA, am acting as Insurance claims handler for Energy manager, DO.  I have reviewed the above documentation for accuracy and completeness, and I agree with the above. [employer], D.O.  The 21st Century Cures Act was signed into law in 2016 which includes the topic of electronic health records.  This provides immediate access to information in MyChart.  This includes consultation notes, operative notes, office notes, lab results and pathology reports.  If you have any questions about what you read please let 2017 know at your next visit so we can discuss your concerns and take corrective action if need be.  We are right here with you.

## 2020-11-01 ENCOUNTER — Encounter (INDEPENDENT_AMBULATORY_CARE_PROVIDER_SITE_OTHER): Payer: Self-pay | Admitting: Physician Assistant

## 2020-11-01 ENCOUNTER — Ambulatory Visit (INDEPENDENT_AMBULATORY_CARE_PROVIDER_SITE_OTHER): Payer: Medicaid Other | Admitting: Physician Assistant

## 2020-11-01 ENCOUNTER — Other Ambulatory Visit: Payer: Self-pay

## 2020-11-01 VITALS — BP 114/68 | HR 84 | Temp 98.1°F | Ht 68.0 in | Wt 394.0 lb

## 2020-11-01 DIAGNOSIS — Z9189 Other specified personal risk factors, not elsewhere classified: Secondary | ICD-10-CM

## 2020-11-01 DIAGNOSIS — E1169 Type 2 diabetes mellitus with other specified complication: Secondary | ICD-10-CM

## 2020-11-01 DIAGNOSIS — E7849 Other hyperlipidemia: Secondary | ICD-10-CM

## 2020-11-01 DIAGNOSIS — E559 Vitamin D deficiency, unspecified: Secondary | ICD-10-CM

## 2020-11-01 DIAGNOSIS — Z6841 Body Mass Index (BMI) 40.0 and over, adult: Secondary | ICD-10-CM

## 2020-11-01 MED ORDER — VITAMIN D (ERGOCALCIFEROL) 1.25 MG (50000 UNIT) PO CAPS: ORAL_CAPSULE | ORAL | 0 refills | Status: DC

## 2020-11-01 MED ORDER — METFORMIN HCL 1000 MG PO TABS: 1000.0000 mg | ORAL_TABLET | Freq: Two times a day (BID) | ORAL | 0 refills | Status: DC

## 2020-11-02 LAB — COMPREHENSIVE METABOLIC PANEL
ALT: 69 IU/L — ABNORMAL HIGH (ref 0–44)
AST: 24 IU/L (ref 0–40)
Albumin/Globulin Ratio: 1 — ABNORMAL LOW (ref 1.2–2.2)
Albumin: 4 g/dL — ABNORMAL LOW (ref 4.1–5.2)
Alkaline Phosphatase: 97 IU/L (ref 51–125)
BUN/Creatinine Ratio: 15 (ref 9–20)
BUN: 13 mg/dL (ref 6–20)
Bilirubin Total: 0.4 mg/dL (ref 0.0–1.2)
CO2: 26 mmol/L (ref 20–29)
Calcium: 9.7 mg/dL (ref 8.7–10.2)
Chloride: 101 mmol/L (ref 96–106)
Creatinine, Ser: 0.87 mg/dL (ref 0.76–1.27)
Globulin, Total: 4 g/dL (ref 1.5–4.5)
Glucose: 90 mg/dL (ref 65–99)
Potassium: 4.6 mmol/L (ref 3.5–5.2)
Sodium: 142 mmol/L (ref 134–144)
Total Protein: 8 g/dL (ref 6.0–8.5)
eGFR: 127 mL/min/{1.73_m2} (ref >59)

## 2020-11-02 LAB — LIPID PANEL
Chol/HDL Ratio: 4.3 ratio (ref 0.0–5.0)
Cholesterol, Total: 190 mg/dL (ref 100–199)
HDL: 44 mg/dL (ref >39)
LDL Chol Calc (NIH): 126 mg/dL — ABNORMAL HIGH (ref 0–99)
Triglycerides: 110 mg/dL (ref 0–149)
VLDL Cholesterol Cal: 20 mg/dL (ref 5–40)

## 2020-11-02 LAB — VITAMIN D 25 HYDROXY (VIT D DEFICIENCY, FRACTURES): Vit D, 25-Hydroxy: 26.7 ng/mL — ABNORMAL LOW (ref 30.0–100.0)

## 2020-11-02 LAB — HEMOGLOBIN A1C
Est. average glucose Bld gHb Est-mCnc: 123 mg/dL
Hgb A1c MFr Bld: 5.9 % — ABNORMAL HIGH (ref 4.8–5.6)

## 2020-11-02 LAB — INSULIN, RANDOM: INSULIN: 34.4 u[IU]/mL — ABNORMAL HIGH (ref 2.6–24.9)

## 2020-11-06 NOTE — Progress Notes (Signed)
Chief Complaint:   OBESITY James Gross is here to discuss his progress with his obesity treatment plan along with follow-up of his obesity related diagnoses. James Gross is on the Category 4 Plan or keeping a food journal and adhering to recommended goals of 2600-2700 calories and 140 grams of protein daily and states he is following his eating plan approximately 75% of the time. James Gross states he is walking for 10 minutes 7 times per week.  Today's visit was #: 16 Starting weight: 471 lbs Starting date: 02/28/2020 Today's weight: 394 lbs Today's date: 11/01/2020 Total lbs lost to date: 77 Total lbs lost since last in-office visit: 13  Interim History: James Gross did a good job with weight loss. He averages between 100-160 grams of protein, and 1800-2200 calories. He is intermittently fasting and his eating window is between 1 pm-8 pm. He struggles to get all of his protein and calories in on some days.  Subjective:   1. Type 2 diabetes mellitus with other specified complication, without long-term current use of insulin (HCC) James Gross is on metformin, and he is tolerating it well. He denies polyphagia.  2. Vitamin D deficiency James Gross is tolerating Vit D twice weekly.  3. Other hyperlipidemia James Gross is not on medications, and he is walking 7 days a week.  4. At risk for heart disease James Gross is at a higher than average risk for cardiovascular disease due to obesity.   Assessment/Plan:   1. Type 2 diabetes mellitus with other specified complication, without long-term current use of insulin (HCC) We will check labs today, and we will refill metformin for 1 month. James Gross will continue to follow up as directed. Good blood sugar control is important to decrease the likelihood of diabetic complications such as nephropathy, neuropathy, limb loss, blindness, coronary artery disease, and death. Intensive lifestyle modification including diet, exercise and weight loss are the first line of treatment for diabetes.   -  metFORMIN (GLUCOPHAGE) 1000 MG tablet; Take 1 tablet (1,000 mg total) by mouth 2 (two) times daily with a meal.  Dispense: 60 tablet; Refill: 0 - Hemoglobin A1c - Comprehensive metabolic panel - Insulin, random  2. Vitamin D deficiency Low Vitamin D level contributes to fatigue and are associated with obesity, breast, and colon cancer. We will check labs today, and we will refill prescription Vitamin D for 1 month. James Gross will follow-up for routine testing of Vitamin D, at least 2-3 times per year to avoid over-replacement.  - Vitamin D, Ergocalciferol, (DRISDOL) 1.25 MG (50000 UNIT) CAPS capsule; 1 po q wed and 1 po q sun  Dispense: 8 capsule; Refill: 0 - VITAMIN D 25 Hydroxy (Vit-D Deficiency, Fractures)  3. Other hyperlipidemia Cardiovascular risk and specific lipid/LDL goals reviewed. We discussed several lifestyle modifications today. We will check labs today. James Gross will continue to work on diet, exercise and weight loss efforts. Orders and follow up as documented in patient record.   Counseling Intensive lifestyle modifications are the first line treatment for this issue. . Dietary changes: Increase soluble fiber. Decrease simple carbohydrates. . Exercise changes: Moderate to vigorous-intensity aerobic activity 150 minutes per week if tolerated. . Lipid-lowering medications: see documented in medical record.  - Lipid panel  4. At risk for heart disease James Gross was given approximately 15 minutes of coronary artery disease prevention counseling today. He is 20 y.o. male and has risk factors for heart disease including obesity. We discussed intensive lifestyle modifications today with an emphasis on specific weight loss instructions and strategies.  Repetitive spaced learning was employed today to elicit superior memory formation and behavioral change.  5. Class 3 severe obesity with serious comorbidity and body mass index (BMI) of 60.0 to 69.9 in adult, unspecified obesity type (HCC) James Gross  is currently in the action stage of change. As such, his goal is to continue with weight loss efforts. He has agreed to keeping a food journal and adhering to recommended goals of 2600-2700 calories and 140 grams of protein daily.   James Gross can make his eating window between 12-8 pm, to allow more time to reach his calorie and protein goals.  Exercise goals: As is.  Behavioral modification strategies: increasing lean protein intake and no skipping meals.  James Gross has agreed to follow-up with our clinic in 3 weeks. He was informed of the importance of frequent follow-up visits to maximize his success with intensive lifestyle modifications for his multiple health conditions.   James Gross was informed we would discuss his lab results at his next visit unless there is a critical issue that needs to be addressed sooner. James Gross agreed to keep his next visit at the agreed upon time to discuss these results.  Objective:   Blood pressure 114/68, pulse 84, temperature 98.1 F (36.7 C), height 5\' 8"  (1.727 m), weight (!) 394 lb (178.7 kg), SpO2 98 %. Body mass index is 59.91 kg/m.  General: Cooperative, alert, well developed, in no acute distress. HEENT: Conjunctivae and lids unremarkable. Cardiovascular: Regular rhythm.  Lungs: Normal work of breathing. Neurologic: No focal deficits.   Lab Results  Component Value Date   CREATININE 0.87 11/01/2020   BUN 13 11/01/2020   NA 142 11/01/2020   K 4.6 11/01/2020   CL 101 11/01/2020   CO2 26 11/01/2020   Lab Results  Component Value Date   ALT 69 (H) 11/01/2020   AST 24 11/01/2020   ALKPHOS 97 11/01/2020   BILITOT 0.4 11/01/2020   Lab Results  Component Value Date   HGBA1C 5.9 (H) 11/01/2020   HGBA1C 6.0 (H) 06/11/2020   HGBA1C 6.6 (H) 02/28/2020   HGBA1C 5.9 07/27/2012   Lab Results  Component Value Date   INSULIN 34.4 (H) 11/01/2020   INSULIN 30.8 (H) 06/11/2020   INSULIN 80.6 (H) 02/28/2020   Lab Results  Component Value Date   TSH 2.690  02/28/2020   Lab Results  Component Value Date   CHOL 190 11/01/2020   HDL 44 11/01/2020   LDLCALC 126 (H) 11/01/2020   TRIG 110 11/01/2020   CHOLHDL 4.3 11/01/2020   Lab Results  Component Value Date   WBC 3.9 02/28/2020   HGB 13.1 02/28/2020   HCT 42.2 02/28/2020   MCV 81 02/28/2020   PLT 322 02/28/2020   No results found for: IRON, TIBC, FERRITIN  Attestation Statements:   Reviewed by clinician on day of visit: allergies, medications, problem list, medical history, surgical history, family history, social history, and previous encounter notes.   03/01/2020, am acting as transcriptionist for Trude Mcburney, PA-C.  I have reviewed the above documentation for accuracy and completeness, and I agree with the above. -  *[employer], PA-C

## 2020-11-22 ENCOUNTER — Other Ambulatory Visit: Payer: Self-pay

## 2020-11-22 ENCOUNTER — Ambulatory Visit (INDEPENDENT_AMBULATORY_CARE_PROVIDER_SITE_OTHER): Payer: Medicaid Other | Admitting: Family Medicine

## 2020-11-22 ENCOUNTER — Encounter (INDEPENDENT_AMBULATORY_CARE_PROVIDER_SITE_OTHER): Payer: Self-pay | Admitting: Family Medicine

## 2020-11-22 VITALS — BP 101/69 | HR 94 | Temp 98.4°F | Ht 68.0 in | Wt 395.0 lb

## 2020-11-22 DIAGNOSIS — K760 Fatty (change of) liver, not elsewhere classified: Secondary | ICD-10-CM | POA: Diagnosis not present

## 2020-11-22 DIAGNOSIS — E559 Vitamin D deficiency, unspecified: Secondary | ICD-10-CM | POA: Diagnosis not present

## 2020-11-22 DIAGNOSIS — Z6841 Body Mass Index (BMI) 40.0 and over, adult: Secondary | ICD-10-CM

## 2020-11-22 DIAGNOSIS — E1169 Type 2 diabetes mellitus with other specified complication: Secondary | ICD-10-CM | POA: Diagnosis not present

## 2020-11-22 DIAGNOSIS — E785 Hyperlipidemia, unspecified: Secondary | ICD-10-CM

## 2020-11-22 MED ORDER — VITAMIN D (ERGOCALCIFEROL) 1.25 MG (50000 UNIT) PO CAPS: ORAL_CAPSULE | ORAL | 0 refills | Status: DC

## 2020-11-28 NOTE — Progress Notes (Signed)
Chief Complaint:   OBESITY Rhythm is here to discuss his progress with his obesity treatment plan along with follow-up of his obesity related diagnoses.   Today's visit was #: 17 Starting weight: 471 lbs Starting date: 02/28/2020 Today's weight: 395 lbs Today's date: 11/22/2020 Weight change since last visit: +1 lb Total lbs lost to date: 76 lbs Body mass index is 60.06 kg/m.  Total weight loss percentage to date: -16.14%  Interim History:  James Gross says that he and his mother are not meal prepping as much lately.  With his leg injury, he did not do as well as he would have like to.  He says he has been struggling with water intake and is getting 6 bottles per day now.  His goal is 1 gallon per day.  Reviewed recent labs with patient.  Current Meal Plan: keeping a food journal and adhering to recommended goals of 2600-2700 calories and 140 grams of protein for 75% of the time.  Current Exercise Plan: Walking for 10 minutes 3 times per day.  Assessment/Plan:   Medications Discontinued During This Encounter  Medication Reason  . Vitamin D, Ergocalciferol, (DRISDOL) 1.25 MG (50000 UNIT) CAPS capsule Reorder    Meds ordered this encounter  Medications  . Vitamin D, Ergocalciferol, (DRISDOL) 1.25 MG (50000 UNIT) CAPS capsule    Sig: 1 po q wed and 1 po q sun    Dispense:  8 capsule    Refill:  0    1. Type 2 diabetes mellitus with other specified complication, without long-term current use of insulin (HCC) Diabetes Mellitus: Not at goal. Medication: metformin 1,000 mg twice daily per PCP. Issues reviewed: blood sugar goals, complications of diabetes mellitus, hypoglycemia prevention and treatment, exercise, and nutrition.  Takes 1 metformin per day on average and occasionally misses days.  Plan:  Discussed labs with patient today.  A1c well controlled.  Continue metformin.  He desires to avoid as much medicine as possible.  However, we discussed semaglutide use and he will speak  with mom about it.  Consider Rybelsus in the near future. The patient will continue to focus on protein-rich, low simple carbohydrate foods. We reviewed the importance of hydration, regular exercise for stress reduction, and restorative sleep.   Lab Results  Component Value Date   HGBA1C 5.9 (H) 11/01/2020   HGBA1C 6.0 (H) 06/11/2020   HGBA1C 6.6 (H) 02/28/2020   Lab Results  Component Value Date   LDLCALC 126 (H) 11/01/2020   CREATININE 0.87 11/01/2020   2. Vitamin D deficiency Not at goal. Current vitamin D is 26.7, tested on 11/01/2020. Optimal goal > 50 ng/dL.  He still is not taking twice weekly vitamin D, but he is much better at remembering than before.   Plan: Discussed labs with patient today.  Continue to take prescription Vitamin D @50 ,000 IU twice weekly as prescribed.  Follow-up for routine testing of Vitamin D, at least 2-3 times per year to avoid over-replacement.  - Refill Vitamin D, Ergocalciferol, (DRISDOL) 1.25 MG (50000 UNIT) CAPS capsule; 1 po q wed and 1 po q sun  Dispense: 8 capsule; Refill: 0  3. NAFLD (nonalcoholic fatty liver disease) He was on pain medications for his leg for 1 month prior to labs.  He thinks this caused them to slightly worsen.  Plan:  Discussed labs with patient today.  Worsening ALT.  Consider GLP-1 in the future if he is amenable.  Continue weight loss via prudent nutritional plan and exercise.  NAFLD is an umbrella term that encompasses a disease spectrum that includes steatosis (fat) without inflammation, steatohepatitis (NASH; fat + inflammation in a characteristic pattern), and cirrhosis. Bland steatosis is felt to be a benign condition, with extremely low to no risk of progression to cirrhosis, whereas NASH can progress to cirrhosis. The mainstay of treatment of NAFLD includes lifestyle modification to achieve weight loss, at least 7% of current body weight. Low carbohydrate diets can be beneficial in improving NAFLD liver histology.  Additionally, exercise, even the absence of weight loss can have beneficial effects on the patient's metabolic profile and liver health.   4. Hyperlipidemia associated with type 2 diabetes mellitus (HCC) Course: Not at goal. Lipid-lowering medications: None.   Plan:  Discussed labs with patient today.  Dietary changes: Increase soluble fiber, decrease simple carbohydrates, decrease saturated fat. Exercise changes: Moderate to vigorous-intensity aerobic activity 150 minutes per week or as tolerated. We will continue to monitor along with PCP/specialists as it pertains to his weight loss journey.  LDL elevated, but slightly improved from 5+ months ago.  Continue prudent nutritional plan and weight loss.  Increase exercise.  Medication management per PCP.  Lab Results  Component Value Date   CHOL 190 11/01/2020   HDL 44 11/01/2020   LDLCALC 126 (H) 11/01/2020   TRIG 110 11/01/2020   CHOLHDL 4.3 11/01/2020   Lab Results  Component Value Date   ALT 69 (H) 11/01/2020   AST 24 11/01/2020   ALKPHOS 97 11/01/2020   BILITOT 0.4 11/01/2020   5. Obesity, current BMI 60.1  Course: James Gross is currently in the action stage of change. As such, his goal is to continue with weight loss efforts.   Nutrition goals: He has agreed to keeping a food journal and adhering to recommended goals of 2600-2700 calories and 140 grams of protein.   Exercise goals: Hisgoal is walking for 20 minutes per day 4 days per week.  Behavioral modification strategies: increasing water intake, meal planning and cooking strategies and planning for success.  Oree has agreed to follow-up with our clinic in 3 weeks. He was informed of the importance of frequent follow-up visits to maximize his success with intensive lifestyle modifications for his multiple health conditions.   Objective:   Blood pressure 101/69, pulse 94, temperature 98.4 F (36.9 C), height 5\' 8"  (1.727 m), weight (!) 395 lb (179.2 kg), SpO2 97 %. Body mass  index is 60.06 kg/m.  General: Cooperative, alert, well developed, in no acute distress. HEENT: Conjunctivae and lids unremarkable. Cardiovascular: Regular rhythm.  Lungs: Normal work of breathing. Neurologic: No focal deficits.   Lab Results  Component Value Date   CREATININE 0.87 11/01/2020   BUN 13 11/01/2020   NA 142 11/01/2020   K 4.6 11/01/2020   CL 101 11/01/2020   CO2 26 11/01/2020   Lab Results  Component Value Date   ALT 69 (H) 11/01/2020   AST 24 11/01/2020   ALKPHOS 97 11/01/2020   BILITOT 0.4 11/01/2020   Lab Results  Component Value Date   HGBA1C 5.9 (H) 11/01/2020   HGBA1C 6.0 (H) 06/11/2020   HGBA1C 6.6 (H) 02/28/2020   HGBA1C 5.9 07/27/2012   Lab Results  Component Value Date   INSULIN 34.4 (H) 11/01/2020   INSULIN 30.8 (H) 06/11/2020   INSULIN 80.6 (H) 02/28/2020   Lab Results  Component Value Date   TSH 2.690 02/28/2020   Lab Results  Component Value Date   CHOL 190 11/01/2020   HDL 44  11/01/2020   LDLCALC 126 (H) 11/01/2020   TRIG 110 11/01/2020   CHOLHDL 4.3 11/01/2020   Lab Results  Component Value Date   WBC 3.9 02/28/2020   HGB 13.1 02/28/2020   HCT 42.2 02/28/2020   MCV 81 02/28/2020   PLT 322 02/28/2020   Attestation Statements:   Reviewed by clinician on day of visit: allergies, medications, problem list, medical history, surgical history, family history, social history, and previous encounter notes.  I, Insurance claims handler, CMA, am acting as Energy manager for [employer], DO.  I have reviewed the above documentation for accuracy and completeness, and I agree with the above. Carlye Grippe, D.O.  The 21st Century Cures Act was signed into law in 2016 which includes the topic of electronic health records.  This provides immediate access to information in MyChart.  This includes consultation notes, operative notes, office notes, lab results and pathology reports.  If you have any questions about what you read please let  us know at your next visit so we can discuss your concerns and take corrective action if need be.  We are right here with you.

## 2020-12-17 ENCOUNTER — Other Ambulatory Visit: Payer: Self-pay

## 2020-12-17 ENCOUNTER — Ambulatory Visit (INDEPENDENT_AMBULATORY_CARE_PROVIDER_SITE_OTHER): Payer: Medicaid Other | Admitting: Family Medicine

## 2020-12-17 VITALS — BP 104/71 | HR 86 | Temp 98.5°F | Ht 68.0 in | Wt 394.0 lb

## 2020-12-17 DIAGNOSIS — E559 Vitamin D deficiency, unspecified: Secondary | ICD-10-CM

## 2020-12-17 DIAGNOSIS — Z6841 Body Mass Index (BMI) 40.0 and over, adult: Secondary | ICD-10-CM | POA: Diagnosis not present

## 2020-12-17 DIAGNOSIS — E1169 Type 2 diabetes mellitus with other specified complication: Secondary | ICD-10-CM | POA: Diagnosis not present

## 2020-12-17 MED ORDER — VITAMIN D (ERGOCALCIFEROL) 1.25 MG (50000 UNIT) PO CAPS: ORAL_CAPSULE | ORAL | 0 refills | Status: DC

## 2020-12-17 MED ORDER — OZEMPIC (0.25 OR 0.5 MG/DOSE) 2 MG/1.5ML ~~LOC~~ SOPN
0.5000 mg | PEN_INJECTOR | SUBCUTANEOUS | 0 refills | Status: DC
Start: 2020-12-17 — End: 2021-02-18

## 2020-12-26 NOTE — Progress Notes (Signed)
Chief Complaint:   OBESITY James Gross is here to discuss his progress with his obesity treatment plan along with follow-up of his obesity related diagnoses.   Today's visit was #: 18 Starting weight: 471 lbs Starting date: 02/28/2020 Today's weight: 394 lbs Today's date: 12/17/2020 Weight change since last visit: 1 lb Total lbs lost to date: 77 lbs Body mass index is 59.91 kg/m.  Total weight loss percentage to date: -16.35%  Interim History:  James Gross says that since his last office visit James Gross has increased his walking to 10 minutes 4 days per week and is hitting his calories and 130+ grams of protein 95% of the time. No hunger or cravings.  James Gross is playing basketball for 40 minutes 4 days per week as well.  Current Meal Plan: keeping a food journal and adhering to recommended goals of 2700 calories and 140 grams of protein for 90-95% of the time.  Current Exercise Plan: Walking for 10 minutes 4 times per week.  Assessment/Plan:   Medications Discontinued During This Encounter  Medication Reason   Vitamin D, Ergocalciferol, (DRISDOL) 1.25 MG (50000 UNIT) CAPS capsule Reorder   Meds ordered this encounter  Medications   Vitamin D, Ergocalciferol, (DRISDOL) 1.25 MG (50000 UNIT) CAPS capsule    Sig: 1 po q wed and 1 po q sun    Dispense:  8 capsule    Refill:  0   Semaglutide,0.25 or 0.5MG /DOS, (OZEMPIC, 0.25 OR 0.5 MG/DOSE,) 2 MG/1.5ML SOPN    Sig: Inject 0.5 mg into the skin once a week.    Dispense:  1.5 mL    Refill:  0    1. Type 2 diabetes mellitus with other specified complication, without long-term current use of insulin (HCC) Diabetes Mellitus: Not at goal. Medication: metformin 1,000 mg twice daily. Issues reviewed: blood sugar goals, complications of diabetes mellitus, hypoglycemia prevention and treatment, exercise, and nutrition.  James Gross says James Gross discussed it with his mom, and there is no family history of thyroid cancer and no history of pancreatitis.  The discussed and James Gross will  start Ozempic today at low dose.  Plan:  Continue metformin at current dose.  Consider decreasing to 500 mg twice daily in the future.  Start Ozempic 0.25 mg subcutaneously weekly after R/B meds d/c pt extensively. We have reviewed the risks and benefits of Ozempic. The patient denies a personal or family history of medullary thyroid cancer or MENII. The patient denies a history of pancreatitis. Alternative treatment options have been discussed. Patient understands that the use of Ozempic in a patient who does not have diabetes is considered off-label use. Patient has been advised of medications that are FDA-approved for obesity treatment. The potential risks and benefits of Ozempic were reviewed with the patient, and alternative treatment options were discussed. All questions were answered, and the patient wishes to move forward with this medication.  Starting dose: 0.25 mg [place] once weekly x 4 weeks Then 0.5 mg [place] once weekly x 4 week Then 1 mg [place] once weekly .    The importance of regular follow up with PCP and all other specialists as scheduled was stressed to patient today. The patient will continue to focus on protein-rich, low simple carbohydrate foods. We reviewed the importance of hydration, regular exercise for stress reduction, and restorative sleep.   Lab Results  Component Value Date   HGBA1C 5.9 (H) 11/01/2020   HGBA1C 6.0 (H) 06/11/2020   HGBA1C 6.6 (H) 02/28/2020   Lab Results  Component Value Date   LDLCALC 126 (H) 11/01/2020   CREATININE 0.87 11/01/2020   - Start Semaglutide,0.25 or 0.5MG /DOS, (OZEMPIC, 0.25 OR 0.5 MG/DOSE,) 2 MG/1.5ML SOPN; Inject 0.5 mg into the skin once a week.  Dispense: 1.5 mL; Refill: 0    2. Vitamin D deficiency Not at goal. Current vitamin D is 26.7, tested on 11/01/2020. Optimal goal > 50 ng/dL.   Plan: Continue to take prescription Vitamin D @50 ,000 IU every week as prescribed.  Follow-up for routine testing of Vitamin D, at least 2-3 times per  year to avoid over-replacement.  - Refill Vitamin D, Ergocalciferol, (DRISDOL) 1.25 MG (50000 UNIT) CAPS capsule; 1 po q wed and 1 po q sun  Dispense: 8 capsule; Refill: 0    3. Class 3 severe obesity with serious comorbidity and body mass index (BMI) of 60.0 to 69.9 in adult, unspecified obesity type (HCC)  Course: James Gross is currently in the action stage of change. As such, his goal is to continue with weight loss efforts.   Nutrition goals: James Gross has agreed to keeping a food journal and adhering to recommended goals of 2700 calories and 140 grams of protein.   Exercise goals:  As is.  Behavioral modification strategies: decreasing simple carbohydrates, meal planning and cooking strategies, and planning for success.  James Gross has agreed to follow-up with our clinic in 2-3 weeks. James Gross was informed of the importance of frequent follow-up visits to maximize his success with intensive lifestyle modifications for his multiple health conditions.    Objective:   Blood pressure 104/71, pulse 86, temperature 98.5 F (36.9 C), height 5\' 8"  (1.727 m), weight (!) 394 lb (178.7 kg), SpO2 98 %. Body mass index is 59.91 kg/m.  General: Cooperative, alert, well developed, in no acute distress. HEENT: Conjunctivae and lids unremarkable. Cardiovascular: Regular rhythm.  Lungs: Normal work of breathing. Neurologic: No focal deficits.   Lab Results  Component Value Date   CREATININE 0.87 11/01/2020   BUN 13 11/01/2020   NA 142 11/01/2020   K 4.6 11/01/2020   CL 101 11/01/2020   CO2 26 11/01/2020   Lab Results  Component Value Date   ALT 69 (H) 11/01/2020   AST 24 11/01/2020   ALKPHOS 97 11/01/2020   BILITOT 0.4 11/01/2020   Lab Results  Component Value Date   HGBA1C 5.9 (H) 11/01/2020   HGBA1C 6.0 (H) 06/11/2020   HGBA1C 6.6 (H) 02/28/2020   HGBA1C 5.9 07/27/2012   Lab Results  Component Value Date   INSULIN 34.4 (H) 11/01/2020   INSULIN 30.8 (H) 06/11/2020   INSULIN 80.6 (H) 02/28/2020    Lab Results  Component Value Date   TSH 2.690 02/28/2020   Lab Results  Component Value Date   CHOL 190 11/01/2020   HDL 44 11/01/2020   LDLCALC 126 (H) 11/01/2020   TRIG 110 11/01/2020   CHOLHDL 4.3 11/01/2020   Lab Results  Component Value Date   WBC 3.9 02/28/2020   HGB 13.1 02/28/2020   HCT 42.2 02/28/2020   MCV 81 02/28/2020   PLT 322 02/28/2020   Attestation Statements:   Reviewed by clinician on day of visit: allergies, medications, problem list, medical history, surgical history, family history, social history, and previous encounter notes.  I, 03/01/2020, CMA, am acting as 03/01/2020 for Insurance claims handler, DO.  I have reviewed the above documentation for accuracy and completeness, and I agree with the above. Energy manager, D.O.  The 21st Century Cures Act was  signed into law in 2016 which includes the topic of electronic health records.  This provides immediate access to information in MyChart.  This includes consultation notes, operative notes, office notes, lab results and pathology reports.  If you have any questions about what you read please let us know at your next visit so we can discuss your concerns and take corrective action if need be.  We are right here with you.

## 2020-12-27 NOTE — Assessment & Plan Note (Signed)
Benefits from CPAP when used as directed. Emphasis on compliance as part of a spectrum of necessary self-discipline Plan- continue auto 12-18

## 2020-12-27 NOTE — Assessment & Plan Note (Signed)
This is controlling his life. Consider [facility].

## 2021-01-02 ENCOUNTER — Ambulatory Visit (INDEPENDENT_AMBULATORY_CARE_PROVIDER_SITE_OTHER): Payer: Medicaid Other | Admitting: Family Medicine

## 2021-01-02 ENCOUNTER — Other Ambulatory Visit: Payer: Self-pay

## 2021-01-02 VITALS — BP 111/70 | HR 99 | Temp 98.0°F | Ht 68.0 in | Wt 396.0 lb

## 2021-01-02 DIAGNOSIS — E66813 Obesity, class 3: Secondary | ICD-10-CM

## 2021-01-02 DIAGNOSIS — E559 Vitamin D deficiency, unspecified: Secondary | ICD-10-CM

## 2021-01-02 DIAGNOSIS — Z6841 Body Mass Index (BMI) 40.0 and over, adult: Secondary | ICD-10-CM

## 2021-01-02 DIAGNOSIS — E1169 Type 2 diabetes mellitus with other specified complication: Secondary | ICD-10-CM | POA: Diagnosis not present

## 2021-01-02 MED ORDER — VITAMIN D (ERGOCALCIFEROL) 1.25 MG (50000 UNIT) PO CAPS: ORAL_CAPSULE | ORAL | 0 refills | Status: DC

## 2021-01-14 NOTE — Progress Notes (Signed)
Chief Complaint:   OBESITY James Gross is here to discuss his progress with his obesity treatment plan along with follow-up of his obesity related diagnoses.   Today's visit was #: 19 Starting weight: 471 lbs Starting date: 02/28/2020 Today's weight: 396 lbs Today's date: 01/02/2021 Weight change since last visit: +2 lbs Total lbs lost to date: 75 lbs Body mass index is 60.21 kg/m.  Total weight loss percentage to date: -15.92%  Interim History:  At James Gross's last office visit, he was started on Ozempic but he has not picked it up.  He went to [facility] over the past weeks and says that some relatives were a bad influence.  However, his clothes fit looser, he is sleeping better, his thighs do not touch like before, and he has less swelling in his legs.  Plan:  Bring in log of date, total calories, and total grams of protein.  Current Meal Plan: keeping a food journal and adhering to recommended goals of 2700 calories and 140 grams of protein for 80% of the time.  Current Exercise Plan: Walking and playing basketball for 10-15 minutes 3 times per week.  Assessment/Plan:   Medications Discontinued During This Encounter  Medication Reason   Vitamin D, Ergocalciferol, (DRISDOL) 1.25 MG (50000 UNIT) CAPS capsule Reorder   Meds ordered this encounter  Medications   Vitamin D, Ergocalciferol, (DRISDOL) 1.25 MG (50000 UNIT) CAPS capsule    Sig: 1 po q wed and 1 po q sun    Dispense:  8 capsule    Refill:  0    Ov for RF    1. Vitamin D deficiency Not at goal.  He is taking vitamin D 50,000 IU twice weekly.    Plan: Continue to take prescription Vitamin D @50 ,000 IU twice weekly as prescribed.  Follow-up for routine testing of Vitamin D, at least 2-3 times per year to avoid over-replacement.  Lab Results  Component Value Date   VD25OH 26.7 (L) 11/01/2020   VD25OH 20.6 (L) 06/11/2020   VD25OH 11.1 (L) 02/28/2020   - Refill Vitamin D, Ergocalciferol, (DRISDOL) 1.25 MG (50000 UNIT) CAPS  capsule; 1 po q wed and 1 po q sun  Dispense: 8 capsule; Refill: 0  2. Type 2 diabetes mellitus with other specified complication, without long-term current use of insulin (HCC) Diabetes Mellitus: Not at goal. Medication: metformin 1,000 mg twice daily. Issues reviewed: blood sugar goals, complications of diabetes mellitus, hypoglycemia prevention and treatment, exercise, and nutrition.  Not checking blood sugar.  No concerns or new symptoms.  James Gross added potatoes back into his meal plan 3 office visits ago and he has stalled with weight loss on the scale since.  Plan:  Start Ozempic.  Once he starts it, decrease metformin dose to 500 mg twice daily.  The importance of regular follow up with PCP and all other specialists as scheduled was stressed to patient today. The patient will continue to focus on protein-rich, low simple carbohydrate foods. We reviewed the importance of hydration, regular exercise for stress reduction, and restorative sleep.   Lab Results  Component Value Date   HGBA1C 5.9 (H) 11/01/2020   HGBA1C 6.0 (H) 06/11/2020   HGBA1C 6.6 (H) 02/28/2020   Lab Results  Component Value Date   LDLCALC 126 (H) 11/01/2020   CREATININE 0.87 11/01/2020   3. Class 3 severe obesity with serious comorbidity and body mass index (BMI) greater than or equal to 70 in adult, unspecified obesity type (HCC)  Course: 11/03/2020  is currently in the action stage of change. As such, his goal is to continue with weight loss efforts.   Nutrition goals: He has agreed to keeping a food journal and adhering to recommended goals of 2700 calories and 140 grams of protein.   Exercise goals:  As is.  Behavioral modification strategies: increasing lean protein intake, decreasing simple carbohydrates, meal planning and cooking strategies, and planning for success.  James Gross has agreed to follow-up with our clinic in 2 weeks. He was informed of the importance of frequent follow-up visits to maximize his success with  intensive lifestyle modifications for his multiple health conditions.   Objective:   Blood pressure 111/70, pulse 99, temperature 98 F (36.7 C), height 5\' 8"  (1.727 m), weight (!) 396 lb (179.6 kg), SpO2 98 %. Body mass index is 60.21 kg/m.  General: Cooperative, alert, well developed, in no acute distress. HEENT: Conjunctivae and lids unremarkable. Cardiovascular: Regular rhythm.  Lungs: Normal work of breathing. Neurologic: No focal deficits.   Lab Results  Component Value Date   CREATININE 0.87 11/01/2020   BUN 13 11/01/2020   NA 142 11/01/2020   K 4.6 11/01/2020   CL 101 11/01/2020   CO2 26 11/01/2020   Lab Results  Component Value Date   ALT 69 (H) 11/01/2020   AST 24 11/01/2020   ALKPHOS 97 11/01/2020   BILITOT 0.4 11/01/2020   Lab Results  Component Value Date   HGBA1C 5.9 (H) 11/01/2020   HGBA1C 6.0 (H) 06/11/2020   HGBA1C 6.6 (H) 02/28/2020   HGBA1C 5.9 07/27/2012   Lab Results  Component Value Date   INSULIN 34.4 (H) 11/01/2020   INSULIN 30.8 (H) 06/11/2020   INSULIN 80.6 (H) 02/28/2020   Lab Results  Component Value Date   TSH 2.690 02/28/2020   Lab Results  Component Value Date   CHOL 190 11/01/2020   HDL 44 11/01/2020   LDLCALC 126 (H) 11/01/2020   TRIG 110 11/01/2020   CHOLHDL 4.3 11/01/2020   Lab Results  Component Value Date   VD25OH 26.7 (L) 11/01/2020   VD25OH 20.6 (L) 06/11/2020   VD25OH 11.1 (L) 02/28/2020   Lab Results  Component Value Date   WBC 3.9 02/28/2020   HGB 13.1 02/28/2020   HCT 42.2 02/28/2020   MCV 81 02/28/2020   PLT 322 02/28/2020   Attestation Statements:   Reviewed by clinician on day of visit: allergies, medications, problem list, medical history, surgical history, family history, social history, and previous encounter notes.  I, 03/01/2020, CMA, am acting as Insurance claims handler for Energy manager, DO.  I have reviewed the above documentation for accuracy and completeness, and I agree with the above. [employer], D.O.  The 21st Century Cures Act was signed into law in 2016 which includes the topic of electronic health records.  This provides immediate access to information in MyChart.  This includes consultation notes, operative notes, office notes, lab results and pathology reports.  If you have any questions about what you read please let 2017 know at your next visit so we can discuss your concerns and take corrective action if need be.  We are right here with you.

## 2021-01-17 ENCOUNTER — Ambulatory Visit (INDEPENDENT_AMBULATORY_CARE_PROVIDER_SITE_OTHER): Payer: Medicaid Other | Admitting: Family Medicine

## 2021-01-17 ENCOUNTER — Other Ambulatory Visit: Payer: Self-pay

## 2021-01-17 ENCOUNTER — Encounter (INDEPENDENT_AMBULATORY_CARE_PROVIDER_SITE_OTHER): Payer: Self-pay | Admitting: Family Medicine

## 2021-01-17 VITALS — BP 113/68 | HR 77 | Temp 98.4°F | Ht 68.0 in | Wt 397.0 lb

## 2021-01-17 DIAGNOSIS — Z6841 Body Mass Index (BMI) 40.0 and over, adult: Secondary | ICD-10-CM

## 2021-01-17 DIAGNOSIS — E559 Vitamin D deficiency, unspecified: Secondary | ICD-10-CM

## 2021-01-17 DIAGNOSIS — E1169 Type 2 diabetes mellitus with other specified complication: Secondary | ICD-10-CM

## 2021-01-17 MED ORDER — VITAMIN D (ERGOCALCIFEROL) 1.25 MG (50000 UNIT) PO CAPS: ORAL_CAPSULE | ORAL | 0 refills | Status: DC

## 2021-01-17 MED ORDER — METFORMIN HCL 1000 MG PO TABS: 1000.0000 mg | ORAL_TABLET | Freq: Two times a day (BID) | ORAL | 0 refills | Status: DC

## 2021-01-22 ENCOUNTER — Encounter (INDEPENDENT_AMBULATORY_CARE_PROVIDER_SITE_OTHER): Payer: Self-pay

## 2021-01-30 NOTE — Progress Notes (Signed)
Chief Complaint:   OBESITY James Gross is here to discuss his progress with his obesity treatment plan along with follow-up of his obesity related diagnoses.   Today's visit was #: 20 Starting weight: 471 lbs Starting date: 02/28/2020 Today's weight: 397 lbs Today's date: 01/17/2021 Weight change since last visit: +1 lbs Total lbs lost to date: 74 lbs Body mass index is 60.36 kg/m.  Total weight loss percentage to date: -15.71%  Interim History:  James Gross brought in his log.  He is hitting 140 grams of protein per day on average and is almost hitting his calorie goals daily within 100 calories except for 1 day within the past couple of weeks.  He wants to set additional goals today - water and exercise.  Plan:  Calculate daily intake of water.  Goal is 1 gallon per day.  Current Meal Plan: keeping a food journal and adhering to recommended goals of 2700 calories and 140 grams of protein for 80% of the time.  Current Exercise Plan: Walking and basketball for 10-15 minutes 3 times per week.  Assessment/Plan:   Medications Discontinued During This Encounter  Medication Reason   metFORMIN (GLUCOPHAGE) 1000 MG tablet Reorder   Vitamin D, Ergocalciferol, (DRISDOL) 1.25 MG (50000 UNIT) CAPS capsule Reorder   Meds ordered this encounter  Medications   metFORMIN (GLUCOPHAGE) 1000 MG tablet    Sig: Take 1 tablet (1,000 mg total) by mouth 2 (two) times daily with a meal.    Dispense:  60 tablet    Refill:  0   Vitamin D, Ergocalciferol, (DRISDOL) 1.25 MG (50000 UNIT) CAPS capsule    Sig: 1 po q wed and 1 po q sun    Dispense:  8 capsule    Refill:  0    Ov for RF    1. Type 2 diabetes mellitus with other specified complication, without long-term current use of insulin (HCC) Diabetes Mellitus: Not at goal. Medication:  Metformin 1,000 mg twice daily. Issues reviewed: blood sugar goals, complications of diabetes mellitus, hypoglycemia prevention and treatment, exercise, and nutrition.  Not  checking blood sugars and never has.  No symptoms or concerns.  Still has not taken Ozempic due to insurance not approving it.   Plan:  Refill metformin.  The importance of regular follow up with PCP and all other specialists as scheduled was stressed to patient today. The patient will continue to focus on protein-rich, low simple carbohydrate foods. We reviewed the importance of hydration, regular exercise for stress reduction, and restorative sleep.   Lab Results  Component Value Date   HGBA1C 5.9 (H) 11/01/2020   HGBA1C 6.0 (H) 06/11/2020   HGBA1C 6.6 (H) 02/28/2020   Lab Results  Component Value Date   LDLCALC 126 (H) 11/01/2020   CREATININE 0.87 11/01/2020   - Refill metFORMIN (GLUCOPHAGE) 1000 MG tablet; Take 1 tablet (1,000 mg total) by mouth 2 (two) times daily with a meal.  Dispense: 60 tablet; Refill: 0  2. Vitamin D deficiency Not at goal.  He is taking vitamin D 50,000 IU twice weekly.  Plan: Continue to take prescription Vitamin D @50 ,000 IU twice weekly as prescribed.  Follow-up for routine testing of Vitamin D, at least 2-3 times per year to avoid over-replacement.  Lab Results  Component Value Date   VD25OH 26.7 (L) 11/01/2020   VD25OH 20.6 (L) 06/11/2020   VD25OH 11.1 (L) 02/28/2020   - Refill Vitamin D, Ergocalciferol, (DRISDOL) 1.25 MG (50000 UNIT) CAPS capsule; 1 po  q wed and 1 po q sun  Dispense: 8 capsule; Refill: 0  3. Class 3 severe obesity with serious comorbidity and body mass index (BMI) of 50.0 to 59.9 in adult, unspecified obesity type (HCC)  Course: James Gross is currently in the action stage of change. As such, his goal is to continue with weight loss efforts.   Nutrition goals: He has agreed to keeping a food journal and adhering to recommended goals of 2700 calories and 140 grams of protein.   Exercise goals:  New goal is 30 minutes of cadio per day (10,000+ steps per day).  Behavioral modification strategies: increasing water intake and keeping a  strict food journal.  [name] has agreed to follow-up with our clinic in 2 weeks.  Will need repeat IC in the near future. He was informed of the importance of frequent follow-up visits to maximize his success with intensive lifestyle modifications for his multiple health conditions.   Objective:   Blood pressure 113/68, pulse 77, temperature 98.4 F (36.9 C), height 5\' 8"  (1.727 m), weight (!) 397 lb (180.1 kg), SpO2 99 %. Body mass index is 60.36 kg/m.  General: Cooperative, alert, well developed, in no acute distress. HEENT: Conjunctivae and lids unremarkable. Cardiovascular: Regular rhythm.  Lungs: Normal work of breathing. Neurologic: No focal deficits.   Lab Results  Component Value Date   CREATININE 0.87 11/01/2020   BUN 13 11/01/2020   NA 142 11/01/2020   K 4.6 11/01/2020   CL 101 11/01/2020   CO2 26 11/01/2020   Lab Results  Component Value Date   ALT 69 (H) 11/01/2020   AST 24 11/01/2020   ALKPHOS 97 11/01/2020   BILITOT 0.4 11/01/2020   Lab Results  Component Value Date   HGBA1C 5.9 (H) 11/01/2020   HGBA1C 6.0 (H) 06/11/2020   HGBA1C 6.6 (H) 02/28/2020   HGBA1C 5.9 07/27/2012   Lab Results  Component Value Date   INSULIN 34.4 (H) 11/01/2020   INSULIN 30.8 (H) 06/11/2020   INSULIN 80.6 (H) 02/28/2020   Lab Results  Component Value Date   TSH 2.690 02/28/2020   Lab Results  Component Value Date   CHOL 190 11/01/2020   HDL 44 11/01/2020   LDLCALC 126 (H) 11/01/2020   TRIG 110 11/01/2020   CHOLHDL 4.3 11/01/2020   Lab Results  Component Value Date   VD25OH 26.7 (L) 11/01/2020   VD25OH 20.6 (L) 06/11/2020   VD25OH 11.1 (L) 02/28/2020   Lab Results  Component Value Date   WBC 3.9 02/28/2020   HGB 13.1 02/28/2020   HCT 42.2 02/28/2020   MCV 81 02/28/2020   PLT 322 02/28/2020   Attestation Statements:   Reviewed by clinician on day of visit: allergies, medications, problem list, medical history, surgical history, family history, social  history, and previous encounter notes.  I, 03/01/2020, CMA, am acting as Insurance claims handler for Energy manager, DO.  I have reviewed the above documentation for accuracy and completeness, and I agree with the above. [employer], D.O.  The 21st Century Cures Act was signed into law in 2016 which includes the topic of electronic health records.  This provides immediate access to information in MyChart.  This includes consultation notes, operative notes, office notes, lab results and pathology reports.  If you have any questions about what you read please let 2017 know at your next visit so we can discuss your concerns and take corrective action if need be.  We are right here with you.

## 2021-02-04 ENCOUNTER — Encounter (INDEPENDENT_AMBULATORY_CARE_PROVIDER_SITE_OTHER): Payer: Self-pay

## 2021-02-04 ENCOUNTER — Other Ambulatory Visit: Payer: Self-pay

## 2021-02-04 ENCOUNTER — Encounter (INDEPENDENT_AMBULATORY_CARE_PROVIDER_SITE_OTHER): Payer: Self-pay | Admitting: Family Medicine

## 2021-02-04 ENCOUNTER — Ambulatory Visit (INDEPENDENT_AMBULATORY_CARE_PROVIDER_SITE_OTHER): Payer: Medicaid Other | Admitting: Family Medicine

## 2021-02-04 VITALS — BP 100/67 | HR 80 | Temp 98.3°F | Ht 68.0 in | Wt 393.0 lb

## 2021-02-04 DIAGNOSIS — E1169 Type 2 diabetes mellitus with other specified complication: Secondary | ICD-10-CM

## 2021-02-04 DIAGNOSIS — E559 Vitamin D deficiency, unspecified: Secondary | ICD-10-CM

## 2021-02-04 DIAGNOSIS — E66813 Obesity, class 3: Secondary | ICD-10-CM

## 2021-02-04 DIAGNOSIS — K760 Fatty (change of) liver, not elsewhere classified: Secondary | ICD-10-CM

## 2021-02-04 DIAGNOSIS — Z6841 Body Mass Index (BMI) 40.0 and over, adult: Secondary | ICD-10-CM

## 2021-02-04 MED ORDER — METFORMIN HCL 1000 MG PO TABS: 1000.0000 mg | ORAL_TABLET | Freq: Two times a day (BID) | ORAL | 0 refills | Status: DC

## 2021-02-06 NOTE — Progress Notes (Addendum)
Chief Complaint:   OBESITY James Gross is here to discuss his progress with his obesity treatment plan along with follow-up of his obesity related diagnoses. James Gross is on keeping a food journal and adhering to recommended goals of 2700 calories and 140 grams of protein daily and states he is following his eating plan approximately 95% of the time. James Gross states he is walking for 30-45 minutes 5-6 times per week.  Today's visit was #: 21 Starting weight: 471 lbs Starting date: 02/28/2020 Today's weight: 393 lbs Today's date: 02/04/2021 Total lbs lost to date: 78 Total lbs lost since last in-office visit: 4  Interim History: Cherry notes mostly every day he hits his calorie and protein goals. He is also exercising 6 days per week now as well.  Subjective:   1. Type 2 diabetes mellitus with other specified complication, without long-term current use of insulin (HCC) James Gross is not checking his BGs , and he has no concern or new symptoms.  2. Vitamin D deficiency James Gross is on prescription Vit D 50,000 IU twice weekly. His medication compliance is good.  3. NAFLD (nonalcoholic fatty liver disease) James Gross has decreased his intake of saturated fats and trans fats.  Assessment/Plan:   Orders Placed This Encounter  Procedures   Hemoglobin A1c   VITAMIN D 25 Hydroxy (Vit-D Deficiency, Fractures)   Comprehensive metabolic panel    Medications Discontinued During This Encounter  Medication Reason   metFORMIN (GLUCOPHAGE) 1000 MG tablet Reorder     Meds ordered this encounter  Medications   metFORMIN (GLUCOPHAGE) 1000 MG tablet    Sig: Take 1 tablet (1,000 mg total) by mouth 2 (two) times daily with a meal.    Dispense:  60 tablet    Refill:  0    Ov for RF     1. Type 2 diabetes mellitus with other specified complication, without long-term current use of insulin (HCC) We will recheck his A1c at his next office visit. We will refill metformin for 1 month. James Gross's insurance wont cover any GLP-1s,  and counseling was done. He will contiue his prudent nutritional plan and exercise. Good blood sugar control is important to decrease the likelihood of diabetic complications such as nephropathy, neuropathy, limb loss, blindness, coronary artery disease, and death. Intensive lifestyle modification including diet, exercise and weight loss are the first line of treatment for diabetes.   - metFORMIN (GLUCOPHAGE) 1000 MG tablet; Take 1 tablet (1,000 mg total) by mouth 2 (two) times daily with a meal.  Dispense: 60 tablet; Refill: 0 - Hemoglobin A1c  2. Vitamin D deficiency Low Vitamin D level contributes to fatigue and are associated with obesity, breast, and colon cancer. We will recheck labs at his next office visit. James Gross will continue prescription Vitamin D 50,000 IU twice weekly and will follow-up for routine testing of Vitamin D, at least 2-3 times per year to avoid over-replacement.  - VITAMIN D 25 Hydroxy (Vit-D Deficiency, Fractures)  3. NAFLD (nonalcoholic fatty liver disease) We discussed the likely diagnosis of non-alcoholic fatty liver disease today and how this condition is obesity related. James Gross was educated the importance of weight loss. We will recheck labs at his next office visit. James Gross agreed to continue with his weight loss efforts with healthier diet and exercise as an essential part of his treatment plan.  - Comprehensive metabolic panel  4. Obesity with current BMI of 59.8 James Gross is currently in the action stage of change. As such, his goal is to continue  with weight loss efforts. He has agreed to keeping a food journal and adhering to recommended goals of 2700 calories and 140 grams of protein daily.   James Gross needs repeat IC at his next office visit. He knows to come fasting 3 hours prior to his visit.  Exercise goals: As is.  Behavioral modification strategies: increasing water intake.  James Gross has agreed to follow-up with our clinic in 2 to 3 weeks. He was informed of the  importance of frequent follow-up visits to maximize his success with intensive lifestyle modifications for his multiple health conditions.   Objective:   Blood pressure 100/67, pulse 80, temperature 98.3 F (36.8 C), height 5\' 8"  (1.727 m), weight (!) 393 lb (178.3 kg), SpO2 99 %. Body mass index is 59.76 kg/m.  General: Cooperative, alert, well developed, in no acute distress. HEENT: Conjunctivae and lids unremarkable. Cardiovascular: Regular rhythm.  Lungs: Normal work of breathing. Neurologic: No focal deficits.   Lab Results  Component Value Date   CREATININE 0.87 11/01/2020   BUN 13 11/01/2020   NA 142 11/01/2020   K 4.6 11/01/2020   CL 101 11/01/2020   CO2 26 11/01/2020   Lab Results  Component Value Date   ALT 69 (H) 11/01/2020   AST 24 11/01/2020   ALKPHOS 97 11/01/2020   BILITOT 0.4 11/01/2020   Lab Results  Component Value Date   HGBA1C 5.9 (H) 11/01/2020   HGBA1C 6.0 (H) 06/11/2020   HGBA1C 6.6 (H) 02/28/2020   HGBA1C 5.9 07/27/2012   Lab Results  Component Value Date   INSULIN 34.4 (H) 11/01/2020   INSULIN 30.8 (H) 06/11/2020   INSULIN 80.6 (H) 02/28/2020   Lab Results  Component Value Date   TSH 2.690 02/28/2020   Lab Results  Component Value Date   CHOL 190 11/01/2020   HDL 44 11/01/2020   LDLCALC 126 (H) 11/01/2020   TRIG 110 11/01/2020   CHOLHDL 4.3 11/01/2020   Lab Results  Component Value Date   VD25OH 26.7 (L) 11/01/2020   VD25OH 20.6 (L) 06/11/2020   VD25OH 11.1 (L) 02/28/2020   Lab Results  Component Value Date   WBC 3.9 02/28/2020   HGB 13.1 02/28/2020   HCT 42.2 02/28/2020   MCV 81 02/28/2020   PLT 322 02/28/2020   No results found for: IRON, TIBC, FERRITIN  Attestation Statements:   Reviewed by clinician on day of visit: allergies, medications, problem list, medical history, surgical history, family history, social history, and previous encounter notes.   03/01/2020, am acting as transcriptionist for Trude Mcburney, DO.  I have reviewed the above documentation for accuracy and completeness, and I agree with the above. [employer], D.O.  The 21st Century Cures Act was signed into law in 2016 which includes the topic of electronic health records.  This provides immediate access to information in MyChart.  This includes consultation notes, operative notes, office notes, lab results and pathology reports.  If you have any questions about what you read please let 2017 know at your next visit so we can discuss your concerns and take corrective action if need be.  We are right here with you.

## 2021-02-18 ENCOUNTER — Ambulatory Visit (INDEPENDENT_AMBULATORY_CARE_PROVIDER_SITE_OTHER): Payer: Medicaid Other | Admitting: Family Medicine

## 2021-02-18 ENCOUNTER — Encounter (INDEPENDENT_AMBULATORY_CARE_PROVIDER_SITE_OTHER): Payer: Self-pay | Admitting: Family Medicine

## 2021-02-18 ENCOUNTER — Other Ambulatory Visit: Payer: Self-pay

## 2021-02-18 ENCOUNTER — Telehealth (INDEPENDENT_AMBULATORY_CARE_PROVIDER_SITE_OTHER): Payer: Self-pay | Admitting: Family Medicine

## 2021-02-18 VITALS — BP 115/73 | HR 65 | Temp 97.7°F | Ht 68.0 in | Wt 398.0 lb

## 2021-02-18 DIAGNOSIS — R0602 Shortness of breath: Secondary | ICD-10-CM | POA: Diagnosis not present

## 2021-02-18 DIAGNOSIS — E1165 Type 2 diabetes mellitus with hyperglycemia: Secondary | ICD-10-CM | POA: Diagnosis not present

## 2021-02-18 DIAGNOSIS — E559 Vitamin D deficiency, unspecified: Secondary | ICD-10-CM

## 2021-02-18 DIAGNOSIS — Z6841 Body Mass Index (BMI) 40.0 and over, adult: Secondary | ICD-10-CM

## 2021-02-18 MED ORDER — VITAMIN D (ERGOCALCIFEROL) 1.25 MG (50000 UNIT) PO CAPS
50000.0000 [IU] | ORAL_CAPSULE | ORAL | 0 refills | Status: DC
Start: 2021-02-18 — End: 2021-04-08

## 2021-02-18 MED ORDER — METFORMIN HCL 1000 MG PO TABS: 1000.0000 mg | ORAL_TABLET | Freq: Two times a day (BID) | ORAL | 0 refills | Status: DC

## 2021-02-18 MED ORDER — TRULICITY 0.75 MG/0.5ML ~~LOC~~ SOAJ: 0.7500 mg | SUBCUTANEOUS | 0 refills | Status: DC

## 2021-02-18 MED ORDER — VITAMIN D (ERGOCALCIFEROL) 1.25 MG (50000 UNIT) PO CAPS: 50000.0000 [IU] | ORAL_CAPSULE | ORAL | 0 refills | Status: DC

## 2021-02-18 NOTE — Telephone Encounter (Signed)
Called to verify prescription.

## 2021-02-18 NOTE — Telephone Encounter (Signed)
[facility] pharmacy called to speak with nurse for clarification on directions for Vitamin D. Pharmacy number [phone].

## 2021-02-19 LAB — COMPREHENSIVE METABOLIC PANEL
ALT: 67 IU/L — ABNORMAL HIGH (ref 0–44)
AST: 23 IU/L (ref 0–40)
Albumin/Globulin Ratio: 1.4 (ref 1.2–2.2)
Albumin: 4.4 g/dL (ref 4.1–5.2)
Alkaline Phosphatase: 108 IU/L (ref 51–125)
BUN/Creatinine Ratio: 23 — ABNORMAL HIGH (ref 9–20)
BUN: 15 mg/dL (ref 6–20)
Bilirubin Total: 0.2 mg/dL (ref 0.0–1.2)
CO2: 24 mmol/L (ref 20–29)
Calcium: 9.4 mg/dL (ref 8.7–10.2)
Chloride: 101 mmol/L (ref 96–106)
Creatinine, Ser: 0.65 mg/dL — ABNORMAL LOW (ref 0.76–1.27)
Globulin, Total: 3.2 g/dL (ref 1.5–4.5)
Glucose: 93 mg/dL (ref 65–99)
Potassium: 4.6 mmol/L (ref 3.5–5.2)
Sodium: 139 mmol/L (ref 134–144)
Total Protein: 7.6 g/dL (ref 6.0–8.5)
eGFR: 138 mL/min/{1.73_m2} (ref >59)

## 2021-02-19 LAB — VITAMIN D 25 HYDROXY (VIT D DEFICIENCY, FRACTURES): Vit D, 25-Hydroxy: 24.2 ng/mL — ABNORMAL LOW (ref 30.0–100.0)

## 2021-02-19 LAB — LIPID PANEL WITH LDL/HDL RATIO
Cholesterol, Total: 179 mg/dL (ref 100–199)
HDL: 47 mg/dL (ref >39)
LDL Chol Calc (NIH): 118 mg/dL — ABNORMAL HIGH (ref 0–99)
LDL/HDL Ratio: 2.5 ratio (ref 0.0–3.6)
Triglycerides: 75 mg/dL (ref 0–149)
VLDL Cholesterol Cal: 14 mg/dL (ref 5–40)

## 2021-02-19 LAB — HEMOGLOBIN A1C
Est. average glucose Bld gHb Est-mCnc: 114 mg/dL
Hgb A1c MFr Bld: 5.6 % (ref 4.8–5.6)

## 2021-02-19 LAB — INSULIN, RANDOM: INSULIN: 26.3 u[IU]/mL — ABNORMAL HIGH (ref 2.6–24.9)

## 2021-02-19 NOTE — Progress Notes (Signed)
Chief Complaint:   OBESITY James Gross is here to discuss his progress with his obesity treatment plan along with follow-up of his obesity related diagnoses. James Gross is on keeping a food journal and adhering to recommended goals of 2700 calories and 140 grams protein and states he is following his eating plan approximately 100% of the time. James Gross states he is walking 45 minutes 4-5 times per week.  Today's visit was #: 22 Starting weight: 471 lbs Starting date: 02/28/2020 Today's weight: 398 lbs Today's date: 02/18/2021 Total lbs lost to date: 73 Total lbs lost since last in-office visit: 0  Interim History: James Gross is following the plan of journaling and getting all calories in and all protein in (getting in over 120 grams protein). He hasn't gone over calories. School is starting up soon.  Subjective:   1. Type 2 diabetes mellitus with hyperglycemia, without long-term current use of insulin (HCC) Ozempic was denied by insurance. James Gross's last A1c was 5.9 and insulin level 34.4.  2. Vitamin D deficiency Pt denies nausea, vomiting, and muscle weakness but notes fatigue. He is on prescription Vit D.  3. SOB (shortness of breath) Pt's symptoms have improved from previous visits.   Assessment/Plan:   1. Type 2 diabetes mellitus with hyperglycemia, without long-term current use of insulin (HCC) Good blood sugar control is important to decrease the likelihood of diabetic complications such as nephropathy, neuropathy, limb loss, blindness, coronary artery disease, and death. Intensive lifestyle modification including diet, exercise and weight loss are the first line of treatment for diabetes. Start Trulicity 0.75 mg, as prescribed below. Check labs today.  Refill- metFORMIN (GLUCOPHAGE) 1000 MG tablet; Take 1 tablet (1,000 mg total) by mouth 2 (two) times daily with a meal.  Dispense: 60 tablet; Refill: 0  - Comprehensive metabolic panel - Hemoglobin A1c - Insulin, random - Lipid Panel With  LDL/HDL Ratio  Start- Dulaglutide (TRULICITY) 0.75 MG/0.5ML SOPN; Inject 0.75 mg into the skin once a week.  Dispense: 2 mL; Refill: 0  2. Vitamin D deficiency Low Vitamin D level contributes to fatigue and are associated with obesity, breast, and colon cancer. He agrees to continue to take prescription Vitamin D 50,000 IU every week and will follow-up for routine testing of Vitamin D, at least 2-3 times per year to avoid over-replacement. Check labs today.  - VITAMIN D 25 Hydroxy (Vit-D Deficiency, Fractures)  Refill- Vitamin D, Ergocalciferol, (DRISDOL) 1.25 MG (50000 UNIT) CAPS capsule; Take 1 capsule (50,000 Units total) by mouth every 7 (seven) days.  Dispense: 4 capsule; Refill: 0  3. SOB (shortness of breath) James Gross does feel that he gets out of breath more easily that he used to when he exercises. James Gross's shortness of breath appears to be obesity related and exercise induced. He has agreed to work on weight loss and gradually increase exercise to treat his exercise induced shortness of breath. Will continue to monitor closely. IC today showing IC [id number].  4. Obesity with current BMI of 60.6  James Gross is currently in the action stage of change. As such, his goal is to continue with weight loss efforts. He has agreed to keeping a food journal and adhering to recommended goals of 2000-2100 calories and 130+ grams protein.   Exercise goals:  Do 15-20 minutes if resistance training.  Behavioral modification strategies: increasing lean protein intake, meal planning and cooking strategies, keeping healthy foods in the home, and planning for success.  James Gross has agreed to follow-up with our clinic in 3 weeks.  He was informed of the importance of frequent follow-up visits to maximize his success with intensive lifestyle modifications for his multiple health conditions.   Objective:   Blood pressure 115/73, pulse 65, temperature 97.7 F (36.5 C), height 5\' 8"  (1.727 m), weight (!) 398 lb (180.5 kg),  SpO2 100 %. Body mass index is 60.52 kg/m.  General: Cooperative, alert, well developed, in no acute distress. HEENT: Conjunctivae and lids unremarkable. Cardiovascular: Regular rhythm.  Lungs: Normal work of breathing. Neurologic: No focal deficits.   Lab Results  Component Value Date   CREATININE 0.65 (L) 02/18/2021   BUN 15 02/18/2021   NA 139 02/18/2021   K 4.6 02/18/2021   CL 101 02/18/2021   CO2 24 02/18/2021   Lab Results  Component Value Date   ALT 67 (H) 02/18/2021   AST 23 02/18/2021   ALKPHOS 108 02/18/2021   BILITOT 0.2 02/18/2021   Lab Results  Component Value Date   HGBA1C 5.6 02/18/2021   HGBA1C 5.9 (H) 11/01/2020   HGBA1C 6.0 (H) 06/11/2020   HGBA1C 6.6 (H) 02/28/2020   HGBA1C 5.9 07/27/2012   Lab Results  Component Value Date   INSULIN 26.3 (H) 02/18/2021   INSULIN 34.4 (H) 11/01/2020   INSULIN 30.8 (H) 06/11/2020   INSULIN 80.6 (H) 02/28/2020   Lab Results  Component Value Date   TSH 2.690 02/28/2020   Lab Results  Component Value Date   CHOL 179 02/18/2021   HDL 47 02/18/2021   LDLCALC 118 (H) 02/18/2021   TRIG 75 02/18/2021   CHOLHDL 4.3 11/01/2020   Lab Results  Component Value Date   VD25OH 24.2 (L) 02/18/2021   VD25OH 26.7 (L) 11/01/2020   VD25OH 20.6 (L) 06/11/2020   Lab Results  Component Value Date   WBC 3.9 02/28/2020   HGB 13.1 02/28/2020   HCT 42.2 02/28/2020   MCV 81 02/28/2020   PLT 322 02/28/2020    Attestation Statements:   Reviewed by clinician on day of visit: allergies, medications, problem list, medical history, surgical history, family history, social history, and previous encounter notes.  Time spent on visit including pre-visit chart review and post-visit care and charting was 25 minutes.   03/01/2020, CMA, am acting as transcriptionist for Edmund Hilda, MD.   I have reviewed the above documentation for accuracy and completeness, and I agree with the above. - Reuben Likes, MD

## 2021-02-25 ENCOUNTER — Ambulatory Visit (INDEPENDENT_AMBULATORY_CARE_PROVIDER_SITE_OTHER): Payer: Medicaid Other | Admitting: Physician Assistant

## 2021-03-18 ENCOUNTER — Other Ambulatory Visit: Payer: Self-pay

## 2021-03-18 ENCOUNTER — Encounter (INDEPENDENT_AMBULATORY_CARE_PROVIDER_SITE_OTHER): Payer: Self-pay | Admitting: Family Medicine

## 2021-03-18 ENCOUNTER — Ambulatory Visit (INDEPENDENT_AMBULATORY_CARE_PROVIDER_SITE_OTHER): Payer: Medicaid Other | Admitting: Family Medicine

## 2021-03-18 VITALS — BP 106/68 | HR 72 | Temp 98.2°F | Ht 68.0 in | Wt 385.0 lb

## 2021-03-18 DIAGNOSIS — E1165 Type 2 diabetes mellitus with hyperglycemia: Secondary | ICD-10-CM | POA: Diagnosis not present

## 2021-03-18 DIAGNOSIS — E7849 Other hyperlipidemia: Secondary | ICD-10-CM | POA: Diagnosis not present

## 2021-03-18 DIAGNOSIS — E559 Vitamin D deficiency, unspecified: Secondary | ICD-10-CM | POA: Diagnosis not present

## 2021-03-18 DIAGNOSIS — K760 Fatty (change of) liver, not elsewhere classified: Secondary | ICD-10-CM | POA: Diagnosis not present

## 2021-03-18 DIAGNOSIS — Z6841 Body Mass Index (BMI) 40.0 and over, adult: Secondary | ICD-10-CM

## 2021-03-18 NOTE — Progress Notes (Signed)
Chief Complaint:   OBESITY James Gross is here to discuss his progress with his obesity treatment plan along with follow-up of his obesity related diagnoses. James Gross is on keeping a food journal and adhering to recommended goals of [phone] calories and 130 grams protein and states he is following his eating plan approximately 80% of the time. James Gross states he is walking 30 minutes 4 times per week.  Today's visit was #: 23 Starting weight: 471 lbs Starting date: 02/28/2020 Today's weight: 385 lbs Today's date: 03/18/2021 Total lbs lost to date: 86 Total lbs lost since last in-office visit: 13  Interim History: At his last OV, James Gross's IC was rechecked and RMR dropped drastically. He denies hunger now, but initially was bad. He denies cravings.  Subjective:   1. Type 2 diabetes mellitus with hyperglycemia, without long-term current use of insulin (HCC) Discussed labs with patient today. James Gross was seen by Dr. Lawson Radar for his last OV. Stopped Ozempic (unable to have medication covered) and started Trulicity, which pt hasn't picked up yet. He is not taking Metformin because he forgets.  Lab Results  Component Value Date   HGBA1C 5.6 02/18/2021   HGBA1C 5.9 (H) 11/01/2020   HGBA1C 6.0 (H) 06/11/2020   Lab Results  Component Value Date   LDLCALC 118 (H) 02/18/2021   CREATININE 0.65 (L) 02/18/2021   Lab Results  Component Value Date   INSULIN 26.3 (H) 02/18/2021   INSULIN 34.4 (H) 11/01/2020   INSULIN 30.8 (H) 06/11/2020   INSULIN 80.6 (H) 02/28/2020   2. Vitamin D deficiency James Gross is not taking Vit D weekly because he forget, as it's just not a priority.  Lab Results  Component Value Date   VD25OH 24.2 (L) 02/18/2021   VD25OH 26.7 (L) 11/01/2020   VD25OH 20.6 (L) 06/11/2020   3. Nonalcoholic hepatosteatosis Discussed labs with patient today. James Gross reports no excessive alcohol or Tylenol use. ALT measurement stable.   Lab Results  Component Value Date   ALT 67 (H) 02/18/2021    AST 23 02/18/2021   ALKPHOS 108 02/18/2021   BILITOT 0.2 02/18/2021   4. Other hyperlipidemia Elevated LDL. Medication: None.  Lab Results  Component Value Date   CHOL 179 02/18/2021   HDL 47 02/18/2021   LDLCALC 118 (H) 02/18/2021   TRIG 75 02/18/2021   CHOLHDL 4.3 11/01/2020   Lab Results  Component Value Date   ALT 67 (H) 02/18/2021   AST 23 02/18/2021   ALKPHOS 108 02/18/2021   BILITOT 0.2 02/18/2021   Assessment/Plan:  No orders of the defined types were placed in this encounter.   Medications Discontinued During This Encounter  Medication Reason   Dulaglutide (TRULICITY) 0.75 MG/0.5ML SOPN      No orders of the defined types were placed in this encounter.    1. Type 2 diabetes mellitus with hyperglycemia, without long-term current use of insulin (HCC) Pick up Trulicity from pharmacy. Start 1/2 tab of Metformin BID (1000 total per day). No refill needed today.  2. Vitamin D deficiency Start taking Vit D regularly as prescribed. Pt denies need for refill today.  3. Nonalcoholic hepatosteatosis Continue prudent nutritional plan and weight loss.  4. Other hyperlipidemia Improving LDL and triglycerides normalized. Continue prudent nutritional plan, exercise, and weight loss.  5. Obesity with current BMI of 58.6  James Gross is currently in the action stage of change. As such, his goal is to continue with weight loss efforts. He has agreed to keeping a food journal  and adhering to recommended goals of [phone] calories and 130 grams protein.   Pt provided with log for calories, proteins, water, and exercise. [name]  Exercise goals: For substantial health benefits, adults should do at least 150 minutes (2 hours and 30 minutes) a week of moderate-intensity, or 75 minutes (1 hour and 15 minutes) a week of vigorous-intensity aerobic physical activity, or an equivalent combination of moderate- and vigorous-intensity aerobic activity. Aerobic activity should be performed in  episodes of at least 10 minutes, and preferably, it should be spread throughout the week. Start exercise- strength training 2 days a week in addition to cardio.  Behavioral modification strategies: increasing water intake and keeping a strict food journal.  James Gross has agreed to follow-up with our clinic in 3-4 weeks. He was informed of the importance of frequent follow-up visits to maximize his success with intensive lifestyle modifications for his multiple health conditions.   Objective:   Blood pressure 106/68, pulse 72, temperature 98.2 F (36.8 C), height 5\' 8"  (1.727 m), weight (!) 385 lb (174.6 kg), SpO2 98 %. Body mass index is 58.54 kg/m.  General: Cooperative, alert, well developed, in no acute distress. HEENT: Conjunctivae and lids unremarkable. Cardiovascular: Regular rhythm.  Lungs: Normal work of breathing. Neurologic: No focal deficits.   Lab Results  Component Value Date   CREATININE 0.65 (L) 02/18/2021   BUN 15 02/18/2021   NA 139 02/18/2021   K 4.6 02/18/2021   CL 101 02/18/2021   CO2 24 02/18/2021   Lab Results  Component Value Date   ALT 67 (H) 02/18/2021   AST 23 02/18/2021   ALKPHOS 108 02/18/2021   BILITOT 0.2 02/18/2021   Lab Results  Component Value Date   HGBA1C 5.6 02/18/2021   HGBA1C 5.9 (H) 11/01/2020   HGBA1C 6.0 (H) 06/11/2020   HGBA1C 6.6 (H) 02/28/2020   HGBA1C 5.9 07/27/2012   Lab Results  Component Value Date   INSULIN 26.3 (H) 02/18/2021   INSULIN 34.4 (H) 11/01/2020   INSULIN 30.8 (H) 06/11/2020   INSULIN 80.6 (H) 02/28/2020   Lab Results  Component Value Date   TSH 2.690 02/28/2020   Lab Results  Component Value Date   CHOL 179 02/18/2021   HDL 47 02/18/2021   LDLCALC 118 (H) 02/18/2021   TRIG 75 02/18/2021   CHOLHDL 4.3 11/01/2020   Lab Results  Component Value Date   VD25OH 24.2 (L) 02/18/2021   VD25OH 26.7 (L) 11/01/2020   VD25OH 20.6 (L) 06/11/2020   Lab Results  Component Value Date   WBC 3.9 02/28/2020    HGB 13.1 02/28/2020   HCT 42.2 02/28/2020   MCV 81 02/28/2020   PLT 322 02/28/2020    Attestation Statements:   Reviewed by clinician on day of visit: allergies, medications, problem list, medical history, surgical history, family history, social history, and previous encounter notes.  03/01/2020, CMA, am acting as transcriptionist for Edmund Hilda, DO.  I have reviewed the above documentation for accuracy and completeness, and I agree with the above. [employer], D.O.  The 21st Century Cures Act was signed into law in 2016 which includes the topic of electronic health records.  This provides immediate access to information in MyChart.  This includes consultation notes, operative notes, office notes, lab results and pathology reports.  If you have any questions about what you read please let 2017 know at your next visit so we can discuss your concerns and take corrective action if need  be.  We are right here with you.

## 2021-04-08 ENCOUNTER — Other Ambulatory Visit: Payer: Self-pay

## 2021-04-08 ENCOUNTER — Encounter (INDEPENDENT_AMBULATORY_CARE_PROVIDER_SITE_OTHER): Payer: Self-pay | Admitting: Family Medicine

## 2021-04-08 ENCOUNTER — Ambulatory Visit (INDEPENDENT_AMBULATORY_CARE_PROVIDER_SITE_OTHER): Payer: Medicaid Other | Admitting: Family Medicine

## 2021-04-08 VITALS — BP 114/71 | HR 67 | Temp 98.2°F | Ht 68.0 in | Wt 385.0 lb

## 2021-04-08 DIAGNOSIS — E559 Vitamin D deficiency, unspecified: Secondary | ICD-10-CM

## 2021-04-08 DIAGNOSIS — Z6841 Body Mass Index (BMI) 40.0 and over, adult: Secondary | ICD-10-CM

## 2021-04-08 DIAGNOSIS — E1165 Type 2 diabetes mellitus with hyperglycemia: Secondary | ICD-10-CM | POA: Diagnosis not present

## 2021-04-08 MED ORDER — VITAMIN D (ERGOCALCIFEROL) 1.25 MG (50000 UNIT) PO CAPS: 50000.0000 [IU] | ORAL_CAPSULE | ORAL | 0 refills | Status: DC

## 2021-04-08 MED ORDER — TRULICITY 0.75 MG/0.5ML ~~LOC~~ SOAJ: 0.7500 mg | SUBCUTANEOUS | 0 refills | Status: DC

## 2021-04-08 NOTE — Progress Notes (Signed)
Chief Complaint:   OBESITY James Gross is here to discuss his progress with his obesity treatment plan along with follow-up of his obesity related diagnoses. James Gross is on keeping a food journal and adhering to recommended goals of 2000-2100 calories and 130 grams of protein daily and states he is following his eating plan approximately 90% of the time. James Gross states he is walking for 40 minutes 3 times per week.  Today's visit was #: 24 Starting weight: 471 lbs Starting date: 02/28/2020 Today's weight: 385 lbs Today's date: 04/08/2021 Total lbs lost to date: 86 Total lbs lost since last in-office visit: 0  Interim History: James Gross is here for a follow up office visit. We reviewed his meal plan and questions were answered. Patient's food recall appears to be accurate and consistent with what is on plan when he is following it- but several days where he didn't eat all foods on plan and didn't prep as well as usual.  When eating on plan, his hunger and cravings are well controlled.    Subjective:   1. Type 2 diabetes mellitus with hyperglycemia, without long-term current use of insulin (HCC) James Gross never went to the pharmacy to pick up his Trulicity from his office visit on 02/18/2021. He was seen last by me on 03/18/2021, and he never let us know he couldn't get it.  Doesn't check BS's.   2. Vitamin D deficiency James Gross is currently taking prescription vitamin D 50,000 IU each week. He denies nausea, vomiting or muscle weakness.  Assessment/Plan:  No orders of the defined types were placed in this encounter.   Medications Discontinued During This Encounter  Medication Reason   Vitamin D, Ergocalciferol, (DRISDOL) 1.25 MG (50000 UNIT) CAPS capsule Reorder   Dulaglutide (TRULICITY) 0.75 MG/0.5ML SOPN Reorder     Meds ordered this encounter  Medications   Vitamin D, Ergocalciferol, (DRISDOL) 1.25 MG (50000 UNIT) CAPS capsule    Sig: Take 1 capsule (50,000 Units total) by mouth every 7  (seven) days.    Dispense:  4 capsule    Refill:  0    30 d supply;  ** OV for RF **   Do not send RF request   Dulaglutide (TRULICITY) 0.75 MG/0.5ML SOPN    Sig: Inject 0.75 mg into the skin once a week.    Dispense:  3 mL    Refill:  0    30 d supply;  ** OV for RF **   Do not send RF request     1. Type 2 diabetes mellitus with hyperglycemia, without long-term current use of insulin (HCC) James Gross agreed to start Trulicity 0.75 mg q weekly with no refills, and he will continue metformin. He is better with taking it and he has been doing twice daily at 1/2 tablet, and he is tolerating it well. Good blood sugar control is important to decrease the likelihood of diabetic complications such as nephropathy, neuropathy, limb loss, blindness, coronary artery disease, and death. Intensive lifestyle modification including diet, exercise and weight loss are the first line of treatment for diabetes.   - Dulaglutide (TRULICITY) 0.75 MG/0.5ML SOPN; Inject 0.75 mg into the skin once a week.  Dispense: 3 mL; Refill: 0   2. Vitamin D deficiency Low Vitamin D level contributes to fatigue and are associated with obesity, breast, and colon cancer. James Gross agreed to continue prescription Vitamin D 50,000 IU every week and we will refill for 1 month. He will follow-up for routine testing of  Vitamin D, at least 2-3 times per year to avoid over-replacement.  - Vitamin D, Ergocalciferol, (DRISDOL) 1.25 MG (50000 UNIT) CAPS capsule; Take 1 capsule (50,000 Units total) by mouth every 7 (seven) days.  Dispense: 4 capsule; Refill: 0   3. Obesity with current BMI of 58.6 James Gross is currently in the action stage of change. As such, his goal is to continue with weight loss efforts. He has agreed to change to keeping a food journal and adhering to recommended goals of 1800-1900 calories and 120+ grams of protein daily.   James Gross is to bring in his log from food journaling, using [employer] to track.  Exercise goals: Add 1 more day  of exercise to his total of 150 minutes per week.  Behavioral modification strategies: keeping healthy foods in the home and planning for success.  James Gross has agreed to follow-up with our clinic in 3 to 4 weeks. He was informed of the importance of frequent follow-up visits to maximize his success with intensive lifestyle modifications for his multiple health conditions.   Objective:   Blood pressure 114/71, pulse 67, temperature 98.2 F (36.8 C), height 5\' 8"  (1.727 m), weight (!) 385 lb (174.6 kg), SpO2 100 %. Body mass index is 58.54 kg/m.  General: Cooperative, alert, well developed, in no acute distress. HEENT: Conjunctivae and lids unremarkable. Cardiovascular: Regular rhythm.  Lungs: Normal work of breathing. Neurologic: No focal deficits.   Lab Results  Component Value Date   CREATININE 0.65 (L) 02/18/2021   BUN 15 02/18/2021   NA 139 02/18/2021   K 4.6 02/18/2021   CL 101 02/18/2021   CO2 24 02/18/2021   Lab Results  Component Value Date   ALT 67 (H) 02/18/2021   AST 23 02/18/2021   ALKPHOS 108 02/18/2021   BILITOT 0.2 02/18/2021   Lab Results  Component Value Date   HGBA1C 5.6 02/18/2021   HGBA1C 5.9 (H) 11/01/2020   HGBA1C 6.0 (H) 06/11/2020   HGBA1C 6.6 (H) 02/28/2020   HGBA1C 5.9 07/27/2012   Lab Results  Component Value Date   INSULIN 26.3 (H) 02/18/2021   INSULIN 34.4 (H) 11/01/2020   INSULIN 30.8 (H) 06/11/2020   INSULIN 80.6 (H) 02/28/2020   Lab Results  Component Value Date   TSH 2.690 02/28/2020   Lab Results  Component Value Date   CHOL 179 02/18/2021   HDL 47 02/18/2021   LDLCALC 118 (H) 02/18/2021   TRIG 75 02/18/2021   CHOLHDL 4.3 11/01/2020   Lab Results  Component Value Date   VD25OH 24.2 (L) 02/18/2021   VD25OH 26.7 (L) 11/01/2020   VD25OH 20.6 (L) 06/11/2020   Lab Results  Component Value Date   WBC 3.9 02/28/2020   HGB 13.1 02/28/2020   HCT 42.2 02/28/2020   MCV 81 02/28/2020   PLT 322 02/28/2020   No results found  for: IRON, TIBC, FERRITIN  Attestation Statements:   Reviewed by clinician on day of visit: allergies, medications, problem list, medical history, surgical history, family history, social history, and previous encounter notes.   03/01/2020, am acting as transcriptionist for Trude Mcburney, DO.  I have reviewed the above documentation for accuracy and completeness, and I agree with the above. [employer], D.O.  The 21st Century Cures Act was signed into law in 2016 which includes the topic of electronic health records.  This provides immediate access to information in MyChart.  This includes consultation notes, operative notes, office notes, lab results and  pathology reports.  If you have any questions about what you read please let us know at your next visit so we can discuss your concerns and take corrective action if need be.  We are right here with you.

## 2021-04-29 ENCOUNTER — Encounter (INDEPENDENT_AMBULATORY_CARE_PROVIDER_SITE_OTHER): Payer: Self-pay

## 2021-04-29 ENCOUNTER — Ambulatory Visit (INDEPENDENT_AMBULATORY_CARE_PROVIDER_SITE_OTHER): Payer: Medicaid Other | Admitting: Family Medicine

## 2021-04-30 ENCOUNTER — Encounter (INDEPENDENT_AMBULATORY_CARE_PROVIDER_SITE_OTHER): Payer: Self-pay

## 2021-05-02 ENCOUNTER — Encounter (INDEPENDENT_AMBULATORY_CARE_PROVIDER_SITE_OTHER): Payer: Self-pay | Admitting: Family Medicine

## 2021-05-02 ENCOUNTER — Ambulatory Visit (INDEPENDENT_AMBULATORY_CARE_PROVIDER_SITE_OTHER): Payer: Medicaid Other | Admitting: Family Medicine

## 2021-05-02 ENCOUNTER — Other Ambulatory Visit: Payer: Self-pay

## 2021-05-02 VITALS — BP 104/66 | HR 65 | Temp 98.1°F | Ht 68.0 in | Wt 381.0 lb

## 2021-05-02 DIAGNOSIS — E559 Vitamin D deficiency, unspecified: Secondary | ICD-10-CM

## 2021-05-02 DIAGNOSIS — E66813 Obesity, class 3: Secondary | ICD-10-CM

## 2021-05-02 DIAGNOSIS — Z6841 Body Mass Index (BMI) 40.0 and over, adult: Secondary | ICD-10-CM | POA: Diagnosis not present

## 2021-05-02 DIAGNOSIS — E1165 Type 2 diabetes mellitus with hyperglycemia: Secondary | ICD-10-CM

## 2021-05-02 MED ORDER — TRULICITY 0.75 MG/0.5ML ~~LOC~~ SOAJ: 0.7500 mg | SUBCUTANEOUS | 0 refills | Status: DC

## 2021-05-06 NOTE — Progress Notes (Signed)
Chief Complaint:   OBESITY James Gross is here to discuss his progress with his obesity treatment plan along with follow-up of his obesity related diagnoses. James Gross is on keeping a food journal and adhering to recommended goals of 1800-1900 calories and 12+ grams of protein daily and states he is following his eating plan approximately 75% of the time. James Gross states he is walking for 30 minutes 2-3 times per week.  Today's visit was #: 25 Starting weight: 471 lbs Starting date: 02/28/2020 Today's weight: 381 lbs Today's date: 05/02/2021 Total lbs lost to date: 90 Total lbs lost since last in-office visit: 4  Interim History: James Gross is here for a follow up office visit. We reviewed his meal plan and questions were answered. Patient's food recall appears to be accurate and consistent with what is on plan when he is following it. When eating on plan, his hunger and cravings are well controlled.   James Gross was occasionally low on his protein intake.  Subjective:   1. Type 2 diabetes mellitus with hyperglycemia, without long-term current use of insulin (HCC) James Gross notes he hasn't been taking his metformin regularly, as he forgets to take it. He hasn't started his Trulicity yet, as he never picked it up from the pharmacy.  2. Vitamin D deficiency James Gross notes he hasn't been taking his Vitamin D for the past 3 weeks.  Assessment/Plan:  No orders of the defined types were placed in this encounter.   Medications Discontinued During This Encounter  Medication Reason   Dulaglutide (TRULICITY) 0.75 MG/0.5ML SOPN Reorder     Meds ordered this encounter  Medications   Dulaglutide (TRULICITY) 0.75 MG/0.5ML SOPN    Sig: Inject 0.75 mg into the skin once a week.    Dispense:  3 mL    Refill:  0    30 d supply;  ** OV for RF **   Do not send RF request     1. Type 2 diabetes mellitus with hyperglycemia, without long-term current use of insulin (HCC) James Gross agreed to start Trulicity 0.75 mg q  weekly with no refills, risks and benefits were discussed with the patient today. He will continue metformin as is. He will bring in his Trulicity to his next office visit. Good blood sugar control is important to decrease the likelihood of diabetic complications such as nephropathy, neuropathy, limb loss, blindness, coronary artery disease, and death. Intensive lifestyle modification including diet, exercise and weight loss are the first line of treatment for diabetes.   - Dulaglutide (TRULICITY) 0.75 MG/0.5ML SOPN; Inject 0.75 mg into the skin once a week.  Dispense: 3 mL; Refill: 0  2. Vitamin D deficiency Low Vitamin D level contributes to fatigue and are associated with obesity, breast, and colon cancer. James Gross agreed to continue prescription Vitamin D 50,000 IU every week and will follow-up for routine testing of Vitamin D, at least 2-3 times per year to avoid over-replacement.  3. Obesity with current BMI of 58.0 James Gross is currently in the action stage of change. As such, his goal is to continue with weight loss efforts. He has agreed to keeping a food journal and adhering to recommended goals of 1800-1900 calories and 120+ grams of protein daily.   James Gross will fill out his calorie and protein log and bring in to his next office visit.   Exercise goals: Hit 150 minutes of exercise per week.  Behavioral modification strategies: increasing water intake, planning for success, and keeping a strict food journal.  James Gross has agreed to follow-up with our clinic in 1 weeks. He was informed of the importance of frequent follow-up visits to maximize his success with intensive lifestyle modifications for his multiple health conditions.   Objective:   Blood pressure 104/66, pulse 65, temperature 98.1 F (36.7 C), height 5\' 8"  (1.727 m), weight (!) 381 lb (172.8 kg), SpO2 99 %. Body mass index is 57.93 kg/m.  General: Cooperative, alert, well developed, in no acute distress. HEENT: Conjunctivae and lids  unremarkable. Cardiovascular: Regular rhythm.  Lungs: Normal work of breathing. Neurologic: No focal deficits.   Lab Results  Component Value Date   CREATININE 0.65 (L) 02/18/2021   BUN 15 02/18/2021   NA 139 02/18/2021   K 4.6 02/18/2021   CL 101 02/18/2021   CO2 24 02/18/2021   Lab Results  Component Value Date   ALT 67 (H) 02/18/2021   AST 23 02/18/2021   ALKPHOS 108 02/18/2021   BILITOT 0.2 02/18/2021   Lab Results  Component Value Date   HGBA1C 5.6 02/18/2021   HGBA1C 5.9 (H) 11/01/2020   HGBA1C 6.0 (H) 06/11/2020   HGBA1C 6.6 (H) 02/28/2020   HGBA1C 5.9 07/27/2012   Lab Results  Component Value Date   INSULIN 26.3 (H) 02/18/2021   INSULIN 34.4 (H) 11/01/2020   INSULIN 30.8 (H) 06/11/2020   INSULIN 80.6 (H) 02/28/2020   Lab Results  Component Value Date   TSH 2.690 02/28/2020   Lab Results  Component Value Date   CHOL 179 02/18/2021   HDL 47 02/18/2021   LDLCALC 118 (H) 02/18/2021   TRIG 75 02/18/2021   CHOLHDL 4.3 11/01/2020   Lab Results  Component Value Date   VD25OH 24.2 (L) 02/18/2021   VD25OH 26.7 (L) 11/01/2020   VD25OH 20.6 (L) 06/11/2020   Lab Results  Component Value Date   WBC 3.9 02/28/2020   HGB 13.1 02/28/2020   HCT 42.2 02/28/2020   MCV 81 02/28/2020   PLT 322 02/28/2020   No results found for: IRON, TIBC, FERRITIN  Attestation Statements:   Reviewed by clinician on day of visit: allergies, medications, problem list, medical history, surgical history, family history, social history, and previous encounter notes.   03/01/2020, am acting as transcriptionist for Trude Mcburney, DO.  I have reviewed the above documentation for accuracy and completeness, and I agree with the above. [employer], D.O.  The 21st Century Cures Act was signed into law in 2016 which includes the topic of electronic health records.  This provides immediate access to information in MyChart.  This includes consultation notes, operative  notes, office notes, lab results and pathology reports.  If you have any questions about what you read please let 2017 know at your next visit so we can discuss your concerns and take corrective action if need be.  We are right here with you.

## 2021-05-09 ENCOUNTER — Other Ambulatory Visit: Payer: Self-pay

## 2021-05-09 ENCOUNTER — Encounter (INDEPENDENT_AMBULATORY_CARE_PROVIDER_SITE_OTHER): Payer: Self-pay | Admitting: Family Medicine

## 2021-05-09 ENCOUNTER — Ambulatory Visit (INDEPENDENT_AMBULATORY_CARE_PROVIDER_SITE_OTHER): Payer: Medicaid Other | Admitting: Family Medicine

## 2021-05-09 VITALS — BP 116/74 | HR 68 | Temp 98.6°F | Ht 68.0 in | Wt 382.0 lb

## 2021-05-09 DIAGNOSIS — Z6841 Body Mass Index (BMI) 40.0 and over, adult: Secondary | ICD-10-CM | POA: Diagnosis not present

## 2021-05-09 DIAGNOSIS — E1165 Type 2 diabetes mellitus with hyperglycemia: Secondary | ICD-10-CM | POA: Diagnosis not present

## 2021-05-09 MED ORDER — BLOOD GLUCOSE MONITOR KIT: PACK | 0 refills | Status: AC

## 2021-05-09 NOTE — Progress Notes (Signed)
Chief Complaint:   OBESITY James Gross is here to discuss his progress with his obesity treatment plan along with follow-up of his obesity related diagnoses. James Gross is on keeping a food journal and adhering to recommended goals of 1800-1900 calories and 120+ grams of protein daily and states he is following his eating plan approximately 80% of the time. James Gross states he is walking for 30-45 minutes 4 times per week.  Today's visit was #: 54 Starting weight: 471 lbs Starting date: 02/28/2020 Today's weight: 382 lbs Today's date: 05/09/2021 Total lbs lost to date: 89 Total lbs lost since last in-office visit: 0  Interim History: James Gross is never over on his calories but he struggled occasionally with protein. He would hit 100 grams of protein instead of 120 grams of protein 2-3 days per week. His hunger is controlled and he denies cravings. He did pick up Trulicity, and he is here to review how to use and inject.  Subjective:   1. Type 2 diabetes mellitus with hyperglycemia, without long-term current use of insulin (HCC) James Gross has a diagnosis of diabetes mellitus II. Medications reviewed. Diabetic ROS: no polyuria or polydipsia, no chest pain, dyspnea or TIA's, no numbness, tingling or pain in extremities.   Assessment/Plan:  No orders of the defined types were placed in this encounter.   Medications Discontinued During This Encounter  Medication Reason   furosemide (LASIX) 40 MG tablet Error   methocarbamol (ROBAXIN) 500 MG tablet Error   Potassium Chloride ER 20 MEQ TBCR Error     Meds ordered this encounter  Medications   blood glucose meter kit and supplies KIT    Sig: Dispense based on patient and insurance preference. Use BID to check blood sugar.    Dispense:  1 each    Refill:  0    Order Specific Question:   Number of strips    Answer:   60    Order Specific Question:   Number of lancets    Answer:   50     1. Type 2 diabetes mellitus with hyperglycemia, without long-term  current use of insulin (HCC) James Gross will continue metformin and Trulicity. We will send in a generic glucometer for him to check his fasting blood sugars and 2 hour post prandial BID. Good blood sugar control is important to decrease the likelihood of diabetic complications such as nephropathy, neuropathy, limb loss, blindness, coronary artery disease, and death. Intensive lifestyle modification including diet, exercise and weight loss are the first line of treatment for diabetes.   - blood glucose meter kit and supplies KIT; Dispense based on patient and insurance preference. Use BID to check blood sugar.  Dispense: 1 each; Refill: 0  2. Obesity with current BMI of 58.1 James Gross is currently in the action stage of change. As such, his goal is to continue with weight loss efforts. He has agreed to keeping a food journal and adhering to recommended goals of 1800-1900 calories and 120+ grams of protein daily.   James Gross was shown how to prep his pen and give himself an injection today. I personally did his first injection for him today.  Exercise goals: As is.  Behavioral modification strategies: increasing lean protein intake and planning for success.  James Gross has agreed to follow-up with our clinic in 2 to 3 weeks. He was informed of the importance of frequent follow-up visits to maximize his success with intensive lifestyle modifications for his multiple health conditions.   Objective:   Blood pressure  116/74, pulse 68, temperature 98.6 F (37 C), height 5' 8"  (1.727 m), weight (!) 382 lb (173.3 kg), SpO2 99 %. Body mass index is 58.08 kg/m.  General: Cooperative, alert, well developed, in no acute distress. HEENT: Conjunctivae and lids unremarkable. Cardiovascular: Regular rhythm.  Lungs: Normal work of breathing. Neurologic: No focal deficits.   Lab Results  Component Value Date   CREATININE 0.65 (L) 02/18/2021   BUN 15 02/18/2021   NA 139 02/18/2021   K 4.6 02/18/2021   CL 101 02/18/2021    CO2 24 02/18/2021   Lab Results  Component Value Date   ALT 67 (H) 02/18/2021   AST 23 02/18/2021   ALKPHOS 108 02/18/2021   BILITOT 0.2 02/18/2021   Lab Results  Component Value Date   HGBA1C 5.6 02/18/2021   HGBA1C 5.9 (H) 11/01/2020   HGBA1C 6.0 (H) 06/11/2020   HGBA1C 6.6 (H) 02/28/2020   HGBA1C 5.9 07/27/2012   Lab Results  Component Value Date   INSULIN 26.3 (H) 02/18/2021   INSULIN 34.4 (H) 11/01/2020   INSULIN 30.8 (H) 06/11/2020   INSULIN 80.6 (H) 02/28/2020   Lab Results  Component Value Date   TSH 2.690 02/28/2020   Lab Results  Component Value Date   CHOL 179 02/18/2021   HDL 47 02/18/2021   LDLCALC 118 (H) 02/18/2021   TRIG 75 02/18/2021   CHOLHDL 4.3 11/01/2020   Lab Results  Component Value Date   VD25OH 24.2 (L) 02/18/2021   VD25OH 26.7 (L) 11/01/2020   VD25OH 20.6 (L) 06/11/2020   Lab Results  Component Value Date   WBC 3.9 02/28/2020   HGB 13.1 02/28/2020   HCT 42.2 02/28/2020   MCV 81 02/28/2020   PLT 322 02/28/2020   No results found for: IRON, TIBC, FERRITIN  Attestation Statements:   Reviewed by clinician on day of visit: allergies, medications, problem list, medical history, surgical history, family history, social history, and previous encounter notes.   Wilhemena Durie, am acting as transcriptionist for [employer], DO.  I have reviewed the above documentation for accuracy and completeness, and I agree with the above. Marjory Sneddon, D.O.  The Reserve was signed into law in 2016 which includes the topic of electronic health records.  This provides immediate access to information in MyChart.  This includes consultation notes, operative notes, office notes, lab results and pathology reports.  If you have any questions about what you read please let us know at your next visit so we can discuss your concerns and take corrective action if need be.  We are right here with you.

## 2021-05-28 ENCOUNTER — Encounter (INDEPENDENT_AMBULATORY_CARE_PROVIDER_SITE_OTHER): Payer: Self-pay

## 2021-05-28 ENCOUNTER — Other Ambulatory Visit: Payer: Self-pay

## 2021-05-28 ENCOUNTER — Encounter (INDEPENDENT_AMBULATORY_CARE_PROVIDER_SITE_OTHER): Payer: Self-pay | Admitting: Family Medicine

## 2021-05-28 ENCOUNTER — Ambulatory Visit (INDEPENDENT_AMBULATORY_CARE_PROVIDER_SITE_OTHER): Payer: Medicaid Other | Admitting: Family Medicine

## 2021-05-28 VITALS — BP 108/70 | HR 82 | Temp 98.2°F | Ht 68.0 in | Wt 379.0 lb

## 2021-05-28 DIAGNOSIS — Z79899 Other long term (current) drug therapy: Secondary | ICD-10-CM

## 2021-05-28 DIAGNOSIS — E559 Vitamin D deficiency, unspecified: Secondary | ICD-10-CM

## 2021-05-28 DIAGNOSIS — E1165 Type 2 diabetes mellitus with hyperglycemia: Secondary | ICD-10-CM | POA: Diagnosis not present

## 2021-05-28 DIAGNOSIS — E66813 Obesity, class 3: Secondary | ICD-10-CM

## 2021-05-28 DIAGNOSIS — Z6841 Body Mass Index (BMI) 40.0 and over, adult: Secondary | ICD-10-CM

## 2021-05-28 MED ORDER — VITAMIN D (ERGOCALCIFEROL) 1.25 MG (50000 UNIT) PO CAPS: 50000.0000 [IU] | ORAL_CAPSULE | ORAL | 0 refills | Status: DC

## 2021-05-28 MED ORDER — METFORMIN HCL 500 MG PO TABS: 500.0000 mg | ORAL_TABLET | Freq: Two times a day (BID) | ORAL | 0 refills | Status: DC

## 2021-05-28 MED ORDER — TRULICITY 0.75 MG/0.5ML ~~LOC~~ SOAJ: 0.7500 mg | SUBCUTANEOUS | 0 refills | Status: DC

## 2021-05-28 NOTE — Progress Notes (Signed)
Chief Complaint:   OBESITY James Gross is here to discuss his progress with his obesity treatment plan along with follow-up of his obesity related diagnoses. James Gross is on keeping a food journal and adhering to recommended goals of 1800-1900 calories and 120+ grams of protein daily and states he is following his eating plan approximately 80% of the time. James Gross states he is walking for 30 minutes 3-4 times per week.  Today's visit was #: 27 Starting weight: 471 lbs Starting date: 02/28/2020 Today's weight: 379 lbs Today's date: 05/28/2021 Total lbs lost to date: 92 Total lbs lost since last in-office visit: 3  Interim History: James Gross is still using [employer] and tracking everyday, and he is doing great with that. His kitchen at home is undergoing renovation and he is unable to meal prep like usual. He is hitting his calorie goal and protein at 110+ grams everyday.   Subjective:   1. Type 2 diabetes mellitus with hyperglycemia, without long-term current use of insulin (HCC) James Gross has taken Trulicity for 3 weeks now. He denies constipation and he is tolerating it well. He has completely cut out his boredom eating and has decreased appetite, but he is able to eat on the plan. His appetite is controlled and no cravings at all.  2. Vitamin D deficiency James Gross has been better with his Vit D, and he takes it 2 times monthly.  3. High risk medication use James Gross is using metformin, which can cause management and B12 deficiency.  Assessment/Plan:   Orders Placed This Encounter  Procedures   Hemoglobin A1c   Insulin, random   TSH   Comprehensive metabolic panel   VITAMIN D 25 Hydroxy (Vit-D Deficiency, Fractures)   CBC with Differential/Platelet   Vitamin B12   Magnesium    Medications Discontinued During This Encounter  Medication Reason   metFORMIN (GLUCOPHAGE) 1000 MG tablet Reorder   Vitamin D, Ergocalciferol, (DRISDOL) 1.25 MG (50000 UNIT) CAPS capsule Reorder   Dulaglutide (TRULICITY) 0.75  MG/0.5ML SOPN Reorder   Vitamin D, Ergocalciferol, (DRISDOL) 1.25 MG (50000 UNIT) CAPS capsule      Meds ordered this encounter  Medications   Dulaglutide (TRULICITY) 0.75 MG/0.5ML SOPN    Sig: Inject 0.75 mg into the skin once a week.    Dispense:  3 mL    Refill:  0    30 d supply;  ** OV for RF **   Do not send RF request   metFORMIN (GLUCOPHAGE) 500 MG tablet    Sig: Take 1 tablet (500 mg total) by mouth 2 (two) times daily with a meal.    Dispense:  60 tablet    Refill:  0   DISCONTD: Vitamin D, Ergocalciferol, (DRISDOL) 1.25 MG (50000 UNIT) CAPS capsule    Sig: Take 1 capsule (50,000 Units total) by mouth every 7 (seven) days.    Dispense:  4 capsule    Refill:  0    30 d supply;  ** OV for RF **   Do not send RF request   Vitamin D, Ergocalciferol, (DRISDOL) 1.25 MG (50000 UNIT) CAPS capsule    Sig: Take 1 capsule (50,000 Units total) by mouth every 7 (seven) days.    Dispense:  4 capsule    Refill:  0    30 d supply;  ** OV for RF **   Do not send RF request     1. Type 2 diabetes mellitus with hyperglycemia, without long-term current use of insulin (HCC) James Gross agreed  to decrease metformin to 500 mg BID (down from 1,000 mg). We will refill Trulicity at the same dose. We will recheck labs at his next office visit. Good blood sugar control is important to decrease the likelihood of diabetic complications such as nephropathy, neuropathy, limb loss, blindness, coronary artery disease, and death. Intensive lifestyle modification including diet, exercise and weight loss are the first line of treatment for diabetes.   - Dulaglutide (TRULICITY) 0.75 MG/0.5ML SOPN; Inject 0.75 mg into the skin once a week.  Dispense: 3 mL; Refill: 0 - metFORMIN (GLUCOPHAGE) 500 MG tablet; Take 1 tablet (500 mg total) by mouth 2 (two) times daily with a meal.  Dispense: 60 tablet; Refill: 0 - Hemoglobin A1c - Insulin, random - TSH - Comprehensive metabolic panel  2. Vitamin D deficiency Low Vitamin  D level contributes to fatigue and are associated with obesity, breast, and colon cancer. James Gross will continue to take his Vit D, and we will recheck labs at his next office visit. He will follow-up for routine testing of Vitamin D, at least 2-3 times per year to avoid over-replacement.  - Vitamin D, Ergocalciferol, (DRISDOL) 1.25 MG (50000 UNIT) CAPS capsule; Take 1 capsule (50,000 Units total) by mouth every 7 (seven) days.  Dispense: 4 capsule; Refill: 0 - VITAMIN D 25 Hydroxy (Vit-D Deficiency, Fractures)  3. High risk medication use We will recheck labs at [facility] next office visit.  - CBC with Differential/Platelet - Vitamin B12 - Magnesium  4. Obesity with current BMI of 57.6 James Gross is currently in the action stage of change. As such, his goal is to continue with weight loss efforts. He has agreed to changed to keeping a food journal and adhering to recommended goals of 1700-1800 calories and 110+ grams of protein daily.   We will recheck fasting labs at his next office visit.  Exercise goals: For substantial health benefits, adults should do at least 150 minutes (2 hours and 30 minutes) a week of moderate-intensity, or 75 minutes (1 hour and 15 minutes) a week of vigorous-intensity aerobic physical activity, or an equivalent combination of moderate- and vigorous-intensity aerobic activity. Aerobic activity should be performed in episodes of at least 10 minutes, and preferably, it should be spread throughout the week.  Behavioral modification strategies: meal planning and cooking strategies, holiday eating strategies , and planning for success.  James Gross has agreed to follow-up with our clinic in 2 to 3 weeks. He was informed of the importance of frequent follow-up visits to maximize his success with intensive lifestyle modifications for his multiple health conditions.   Objective:   Blood pressure 108/70, pulse 82, temperature 98.2 F (36.8 C), height 5\' 8"  (1.727 m), weight (!) 379 lb (171.9  kg), SpO2 99 %. Body mass index is 57.63 kg/m.  General: Cooperative, alert, well developed, in no acute distress. HEENT: Conjunctivae and lids unremarkable. Cardiovascular: Regular rhythm.  Lungs: Normal work of breathing. Neurologic: No focal deficits.   Lab Results  Component Value Date   CREATININE 0.65 (L) 02/18/2021   BUN 15 02/18/2021   NA 139 02/18/2021   K 4.6 02/18/2021   CL 101 02/18/2021   CO2 24 02/18/2021   Lab Results  Component Value Date   ALT 67 (H) 02/18/2021   AST 23 02/18/2021   ALKPHOS 108 02/18/2021   BILITOT 0.2 02/18/2021   Lab Results  Component Value Date   HGBA1C 5.6 02/18/2021   HGBA1C 5.9 (H) 11/01/2020   HGBA1C 6.0 (H) 06/11/2020   HGBA1C  6.6 (H) 02/28/2020   HGBA1C 5.9 07/27/2012   Lab Results  Component Value Date   INSULIN 26.3 (H) 02/18/2021   INSULIN 34.4 (H) 11/01/2020   INSULIN 30.8 (H) 06/11/2020   INSULIN 80.6 (H) 02/28/2020   Lab Results  Component Value Date   TSH 2.690 02/28/2020   Lab Results  Component Value Date   CHOL 179 02/18/2021   HDL 47 02/18/2021   LDLCALC 118 (H) 02/18/2021   TRIG 75 02/18/2021   CHOLHDL 4.3 11/01/2020   Lab Results  Component Value Date   VD25OH 24.2 (L) 02/18/2021   VD25OH 26.7 (L) 11/01/2020   VD25OH 20.6 (L) 06/11/2020   Lab Results  Component Value Date   WBC 3.9 02/28/2020   HGB 13.1 02/28/2020   HCT 42.2 02/28/2020   MCV 81 02/28/2020   PLT 322 02/28/2020   No results found for: IRON, TIBC, FERRITIN  Attestation Statements:   Reviewed by clinician on day of visit: allergies, medications, problem list, medical history, surgical history, family history, social history, and previous encounter notes.   Trude Mcburney, am acting as transcriptionist for [employer], DO.  I have reviewed the above documentation for accuracy and completeness, and I agree with the above. Carlye Grippe, D.O.  The 21st Century Cures Act was signed into law in 2016 which  includes the topic of electronic health records.  This provides immediate access to information in MyChart.  This includes consultation notes, operative notes, office notes, lab results and pathology reports.  If you have any questions about what you read please let us know at your next visit so we can discuss your concerns and take corrective action if need be.  We are right here with you.

## 2021-06-18 ENCOUNTER — Ambulatory Visit (INDEPENDENT_AMBULATORY_CARE_PROVIDER_SITE_OTHER): Payer: Medicaid Other | Admitting: Family Medicine

## 2021-06-18 ENCOUNTER — Other Ambulatory Visit: Payer: Self-pay

## 2021-06-18 ENCOUNTER — Encounter (INDEPENDENT_AMBULATORY_CARE_PROVIDER_SITE_OTHER): Payer: Self-pay | Admitting: Family Medicine

## 2021-06-18 VITALS — BP 107/72 | HR 81 | Temp 98.3°F | Ht 68.0 in | Wt 384.0 lb

## 2021-06-18 DIAGNOSIS — E1165 Type 2 diabetes mellitus with hyperglycemia: Secondary | ICD-10-CM | POA: Diagnosis not present

## 2021-06-18 DIAGNOSIS — Z6841 Body Mass Index (BMI) 40.0 and over, adult: Secondary | ICD-10-CM

## 2021-06-18 DIAGNOSIS — E559 Vitamin D deficiency, unspecified: Secondary | ICD-10-CM

## 2021-06-18 MED ORDER — METFORMIN HCL 500 MG PO TABS: 500.0000 mg | ORAL_TABLET | Freq: Two times a day (BID) | ORAL | 0 refills | Status: DC

## 2021-06-18 MED ORDER — TRULICITY 1.5 MG/0.5ML ~~LOC~~ SOAJ: 1.5000 mg | SUBCUTANEOUS | 0 refills | Status: DC

## 2021-06-18 MED ORDER — VITAMIN D (ERGOCALCIFEROL) 1.25 MG (50000 UNIT) PO CAPS: 50000.0000 [IU] | ORAL_CAPSULE | ORAL | 0 refills | Status: DC

## 2021-06-19 LAB — HEMOGLOBIN A1C
Est. average glucose Bld gHb Est-mCnc: 120 mg/dL
Hgb A1c MFr Bld: 5.8 % — ABNORMAL HIGH (ref 4.8–5.6)

## 2021-06-19 LAB — CBC WITH DIFFERENTIAL/PLATELET
Basophils Absolute: 0 10*3/uL (ref 0.0–0.2)
Basos: 0 %
EOS (ABSOLUTE): 0.1 10*3/uL (ref 0.0–0.4)
Eos: 2 %
Hematocrit: 43 % (ref 37.5–51.0)
Hemoglobin: 14 g/dL (ref 13.0–17.7)
Immature Grans (Abs): 0 10*3/uL (ref 0.0–0.1)
Immature Granulocytes: 1 %
Lymphocytes Absolute: 1.5 10*3/uL (ref 0.7–3.1)
Lymphs: 33 %
MCH: 26 pg — ABNORMAL LOW (ref 26.6–33.0)
MCHC: 32.6 g/dL (ref 31.5–35.7)
MCV: 80 fL (ref 79–97)
Monocytes Absolute: 0.4 10*3/uL (ref 0.1–0.9)
Monocytes: 9 %
Neutrophils Absolute: 2.5 10*3/uL (ref 1.4–7.0)
Neutrophils: 55 %
Platelets: 285 10*3/uL (ref 150–450)
RBC: 5.39 x10E6/uL (ref 4.14–5.80)
RDW: 13.9 % (ref 11.6–15.4)
WBC: 4.6 10*3/uL (ref 3.4–10.8)

## 2021-06-19 LAB — COMPREHENSIVE METABOLIC PANEL
ALT: 40 IU/L (ref 0–44)
AST: 20 IU/L (ref 0–40)
Albumin/Globulin Ratio: 1.4 (ref 1.2–2.2)
Albumin: 4.4 g/dL (ref 4.1–5.2)
Alkaline Phosphatase: 92 IU/L (ref 44–121)
BUN/Creatinine Ratio: 18 (ref 9–20)
BUN: 15 mg/dL (ref 6–20)
Bilirubin Total: 0.3 mg/dL (ref 0.0–1.2)
CO2: 25 mmol/L (ref 20–29)
Calcium: 9.5 mg/dL (ref 8.7–10.2)
Chloride: 100 mmol/L (ref 96–106)
Creatinine, Ser: 0.83 mg/dL (ref 0.76–1.27)
Globulin, Total: 3.1 g/dL (ref 1.5–4.5)
Glucose: 90 mg/dL (ref 70–99)
Potassium: 4.5 mmol/L (ref 3.5–5.2)
Sodium: 139 mmol/L (ref 134–144)
Total Protein: 7.5 g/dL (ref 6.0–8.5)
eGFR: 128 mL/min/{1.73_m2} (ref >59)

## 2021-06-19 LAB — VITAMIN D 25 HYDROXY (VIT D DEFICIENCY, FRACTURES): Vit D, 25-Hydroxy: 21 ng/mL — ABNORMAL LOW (ref 30.0–100.0)

## 2021-06-19 LAB — TSH: TSH: 4.49 u[IU]/mL (ref 0.450–4.500)

## 2021-06-19 LAB — VITAMIN B12: Vitamin B-12: 386 pg/mL (ref 232–1245)

## 2021-06-19 LAB — MAGNESIUM: Magnesium: 2 mg/dL (ref 1.6–2.3)

## 2021-06-19 LAB — INSULIN, RANDOM: INSULIN: 35.2 u[IU]/mL — ABNORMAL HIGH (ref 2.6–24.9)

## 2021-06-19 NOTE — Progress Notes (Signed)
Chief Complaint:   OBESITY James Gross is here to discuss his progress with his obesity treatment plan along with follow-up of his obesity related diagnoses. James Gross is on keeping a food journal and adhering to recommended goals of 1700-1800 calories and 110 grams protein and states he is following his eating plan approximately 70% of the time. James Gross states he is walking 30 minutes 1-2 times per week.  Today's visit was #: 28 Starting weight: 471 lbs Starting date: 02/28/2020 Today's weight: 384 lbs Today's date: 06/18/2021 Total lbs lost to date: 87 Total lbs lost since last in-office visit: +5  Interim History:  James Gross had a birthday [birth year] and he states it's been a challenge to get back on plan ever since that time.  During his birthday week, he ate high carb, calorie-rich meals and off-plan foods, but he did GREAT over Thanksgiving with his PC/[employer] and even lost weight during that time. He endorses little exercise during that past couple of weeks which he is disappointed with.  Subjective:   1. Type 2 diabetes mellitus with hyperglycemia, without long-term current use of insulin (HCC) James Gross is still not taking Metformin BID (can't remember to take twice daily).    2. Vitamin D deficiency He is currently taking prescription vitamin D 50,000 IU each week. He denies nausea, vomiting or muscle weakness. He endorse James Gross compliance with the medicine at this time.  Assessment/Plan:  No orders of the defined types were placed in this encounter.   Medications Discontinued During This Encounter  Medication Reason   Dulaglutide (TRULICITY) 0.75 MG/0.5ML SOPN    metFORMIN (GLUCOPHAGE) 500 MG tablet Reorder   Vitamin D, Ergocalciferol, (DRISDOL) 1.25 MG (50000 UNIT) CAPS capsule Reorder     Meds ordered this encounter  Medications   metFORMIN (GLUCOPHAGE) 500 MG tablet    Sig: Take 1 tablet (500 mg total) by mouth 2 (two) times daily with a meal.    Dispense:  60 tablet    Refill:  0   Vitamin  D, Ergocalciferol, (DRISDOL) 1.25 MG (50000 UNIT) CAPS capsule    Sig: Take 1 capsule (50,000 Units total) by mouth every 7 (seven) days.    Dispense:  4 capsule    Refill:  0    30 d supply;  ** OV for RF **   Do not send RF request   Dulaglutide (TRULICITY) 1.5 MG/0.5ML SOPN    Sig: Inject 1.5 mg into the skin once a week.    Dispense:  2 mL    Refill:  0     1. Type 2 diabetes mellitus with hyperglycemia, without long-term current use of insulin (HCC) James Gross blood sugar control is important to decrease the likelihood of diabetic complications such as nephropathy, neuropathy, limb loss, blindness, coronary artery disease, and death. Intensive lifestyle modification including diet, exercise and weight loss are the first line of treatment for diabetes .  Pt will increase Trulicity to 1.5 mg as directed. Provided pt with medication organizer so he can remember to take meds more consistently.  Refill- metFORMIN (GLUCOPHAGE) 500 MG tablet; Take 1 tablet (500 mg total) by mouth 2 (two) times daily with a meal.  Dispense: 60 tablet; Refill: 0  INcrease & Refill- Dulaglutide (TRULICITY) 1.5 MG/0.5ML SOPN; Inject 1.5 mg into the skin once a week.  Dispense: 2 mL; Refill: 0    2. Vitamin D deficiency Low Vitamin D level contributes to fatigue and are associated with obesity, breast, and colon cancer. He agrees to  continue to take prescription Vitamin D 50,000 IU every week and will follow-up for routine testing of Vitamin D, at least 2-3 times per year to avoid over-replacement.  Refill- Vitamin D, Ergocalciferol, (DRISDOL) 1.25 MG (50000 UNIT) CAPS capsule; Take 1 capsule (50,000 Units total) by mouth every 7 (seven) days.  Dispense: 4 capsule; Refill: 0    3. Obesity with current BMI of 58.4  James Gross is currently in the action stage of change. As such, his goal is to continue with weight loss efforts. He has DESIRES to start back journaling by keeping a food journal and adhering to recommended  goals of 1700-1800 calories and 120 grams protein.  He did much better staying on plan when he journals  Go over labs obtained today at next OV.   Handout: Holiday Eating Strategies  Exercise goals: All adults should avoid inactivity. Some physical activity is better than none, and adults who participate in any amount of physical activity gain some health benefits.  Behavioral modification strategies: increasing water intake, dealing with family or coworker sabotage, holiday eating strategies , and avoiding temptations.  James Gross has agreed to follow-up with our clinic in 3 weeks with PA Tracey. He was informed of the importance of frequent follow-up visits to maximize his success with intensive lifestyle modifications for his multiple health conditions.   Objective:   Blood pressure 107/72, pulse 81, temperature 98.3 F (36.8 C), height 5\' 8"  (1.727 m), weight (!) 384 lb (174.2 kg), SpO2 99 %. Body mass index is 58.39 kg/m.  General: Cooperative, alert, well developed, in no acute distress. HEENT: Conjunctivae and lids unremarkable. Cardiovascular: Regular rhythm.  Lungs: Normal work of breathing. Neurologic: No focal deficits.   Lab Results  Component Value Date   CREATININE 0.83 06/18/2021   BUN 15 06/18/2021   NA 139 06/18/2021   K 4.5 06/18/2021   CL 100 06/18/2021   CO2 25 06/18/2021   Lab Results  Component Value Date   ALT 40 06/18/2021   AST 20 06/18/2021   ALKPHOS 92 06/18/2021   BILITOT 0.3 06/18/2021   Lab Results  Component Value Date   HGBA1C 5.8 (H) 06/18/2021   HGBA1C 5.6 02/18/2021   HGBA1C 5.9 (H) 11/01/2020   HGBA1C 6.0 (H) 06/11/2020   HGBA1C 6.6 (H) 02/28/2020   Lab Results  Component Value Date   INSULIN 35.2 (H) 06/18/2021   INSULIN 26.3 (H) 02/18/2021   INSULIN 34.4 (H) 11/01/2020   INSULIN 30.8 (H) 06/11/2020   INSULIN 80.6 (H) 02/28/2020   Lab Results  Component Value Date   TSH 4.490 06/18/2021   Lab Results  Component Value Date    CHOL 179 02/18/2021   HDL 47 02/18/2021   LDLCALC 118 (H) 02/18/2021   TRIG 75 02/18/2021   CHOLHDL 4.3 11/01/2020   Lab Results  Component Value Date   VD25OH WILL FOLLOW 06/18/2021   VD25OH 24.2 (L) 02/18/2021   VD25OH 26.7 (L) 11/01/2020   Lab Results  Component Value Date   WBC 4.6 06/18/2021   HGB 14.0 06/18/2021   HCT 43.0 06/18/2021   MCV 80 06/18/2021   PLT 285 06/18/2021    Attestation Statements:   Reviewed by clinician on day of visit: allergies, medications, problem list, medical history, surgical history, family history, social history, and previous encounter notes.  06/20/2021, CMA, am acting as transcriptionist for Edmund Hilda, DO.  I have reviewed the above documentation for accuracy and completeness, and I agree with the above. -  Marjory Sneddon, D.O.  The [place] was signed into law in 2016 which includes the topic of electronic health records.  This provides immediate access to information in MyChart.  This includes consultation notes, operative notes, office notes, lab results and pathology reports.  If you have any questions about what you read please let us know at your next visit so we can discuss your concerns and take corrective action if need be.  We are right here with you.

## 2021-07-10 ENCOUNTER — Other Ambulatory Visit: Payer: Self-pay

## 2021-07-10 ENCOUNTER — Ambulatory Visit (INDEPENDENT_AMBULATORY_CARE_PROVIDER_SITE_OTHER): Payer: Medicaid Other | Admitting: Physician Assistant

## 2021-07-10 ENCOUNTER — Encounter (INDEPENDENT_AMBULATORY_CARE_PROVIDER_SITE_OTHER): Payer: Self-pay | Admitting: Physician Assistant

## 2021-07-10 VITALS — BP 106/66 | HR 85 | Temp 98.1°F | Ht 68.0 in | Wt 396.0 lb

## 2021-07-10 DIAGNOSIS — Z6841 Body Mass Index (BMI) 40.0 and over, adult: Secondary | ICD-10-CM

## 2021-07-10 DIAGNOSIS — E1165 Type 2 diabetes mellitus with hyperglycemia: Secondary | ICD-10-CM | POA: Diagnosis not present

## 2021-07-10 MED ORDER — METFORMIN HCL 500 MG PO TABS: 500.0000 mg | ORAL_TABLET | Freq: Two times a day (BID) | ORAL | 0 refills | Status: DC

## 2021-07-10 MED ORDER — TRULICITY 1.5 MG/0.5ML ~~LOC~~ SOAJ: 1.5000 mg | SUBCUTANEOUS | 0 refills | Status: DC

## 2021-07-11 NOTE — Progress Notes (Signed)
Chief Complaint:   OBESITY James Gross is here to discuss his progress with his obesity treatment plan along with follow-up of his obesity related diagnoses. James Gross is on keeping a food journal and adhering to recommended goals of 1700-1800 calories and 120 grams protein and states he is following his eating plan approximately 0% of the time. James Gross states he is walking 30 minutes 1 times per week.  Today's visit was #: 29 Starting weight: 471 lbs Starting date: 02/28/2020 Today's weight: 396 lbs Today's date: 07/10/2021 Total lbs lost to date: 75 Total lbs lost since last in-office visit: 0  Interim History: James Gross got off plan over the Christmas holiday and did not journal. He started meal planning and prepping yesterday. He got a gym membership to [employer] and is going to start working out.  Subjective:   1. Type 2 diabetes mellitus with hyperglycemia, without long-term current use of insulin (HCC) Pt is on Trulicity 1.5 mg weekly and Metformin BID. His last A1c was 5.8.  Assessment/Plan:   1. Type 2 diabetes mellitus with hyperglycemia, without long-term current use of insulin (HCC) Good blood sugar control is important to decrease the likelihood of diabetic complications such as nephropathy, neuropathy, limb loss, blindness, coronary artery disease, and death. Intensive lifestyle modification including diet, exercise and weight loss are the first line of treatment for diabetes.   Refill- Dulaglutide (TRULICITY) 1.5 MG/0.5ML SOPN; Inject 1.5 mg into the skin once a week.  Dispense: 2 mL; Refill: 0 Refill- metFORMIN (GLUCOPHAGE) 500 MG tablet; Take 1 tablet (500 mg total) by mouth 2 (two) times daily with a meal.  Dispense: 60 tablet; Refill: 0  2. Obesity with current BMI of 60.23  James Gross is currently in the action stage of change. As such, his goal is to continue with weight loss efforts. He has agreed to keeping a food journal and adhering to recommended goals of 1700-1800 calories  and 120 grams protein.   Exercise goals:  As is  Behavioral modification strategies: meal planning and cooking strategies and planning for success.  James Gross has agreed to follow-up with our clinic in 2 weeks. He was informed of the importance of frequent follow-up visits to maximize his success with intensive lifestyle modifications for his multiple health conditions.   Objective:   Blood pressure 106/66, pulse 85, temperature 98.1 F (36.7 C), height 5\' 8"  (1.727 m), weight (!) 396 lb (179.6 kg), SpO2 98 %. Body mass index is 60.21 kg/m.  General: Cooperative, alert, well developed, in no acute distress. HEENT: Conjunctivae and lids unremarkable. Cardiovascular: Regular rhythm.  Lungs: Normal work of breathing. Neurologic: No focal deficits.   Lab Results  Component Value Date   CREATININE 0.83 06/18/2021   BUN 15 06/18/2021   NA 139 06/18/2021   K 4.5 06/18/2021   CL 100 06/18/2021   CO2 25 06/18/2021   Lab Results  Component Value Date   ALT 40 06/18/2021   AST 20 06/18/2021   ALKPHOS 92 06/18/2021   BILITOT 0.3 06/18/2021   Lab Results  Component Value Date   HGBA1C 5.8 (H) 06/18/2021   HGBA1C 5.6 02/18/2021   HGBA1C 5.9 (H) 11/01/2020   HGBA1C 6.0 (H) 06/11/2020   HGBA1C 6.6 (H) 02/28/2020   Lab Results  Component Value Date   INSULIN 35.2 (H) 06/18/2021   INSULIN 26.3 (H) 02/18/2021   INSULIN 34.4 (H) 11/01/2020   INSULIN 30.8 (H) 06/11/2020   INSULIN 80.6 (H) 02/28/2020   Lab Results  Component  Value Date   TSH 4.490 06/18/2021   Lab Results  Component Value Date   CHOL 179 02/18/2021   HDL 47 02/18/2021   LDLCALC 118 (H) 02/18/2021   TRIG 75 02/18/2021   CHOLHDL 4.3 11/01/2020   Lab Results  Component Value Date   VD25OH 21.0 (L) 06/18/2021   VD25OH 24.2 (L) 02/18/2021   VD25OH 26.7 (L) 11/01/2020   Lab Results  Component Value Date   WBC 4.6 06/18/2021   HGB 14.0 06/18/2021   HCT 43.0 06/18/2021   MCV 80 06/18/2021   PLT 285  06/18/2021    Attestation Statements:   Reviewed by clinician on day of visit: allergies, medications, problem list, medical history, surgical history, family history, social history, and previous encounter notes.  Edmund Hilda, CMA, am acting as transcriptionist for [employer], PA-C.  I have reviewed the above documentation for accuracy and completeness, and I agree with the above. Alois Cliche, PA-C

## 2021-07-24 ENCOUNTER — Other Ambulatory Visit: Payer: Self-pay

## 2021-07-24 ENCOUNTER — Encounter (INDEPENDENT_AMBULATORY_CARE_PROVIDER_SITE_OTHER): Payer: Self-pay | Admitting: Family Medicine

## 2021-07-24 ENCOUNTER — Ambulatory Visit (INDEPENDENT_AMBULATORY_CARE_PROVIDER_SITE_OTHER): Payer: Medicaid Other | Admitting: Family Medicine

## 2021-07-24 VITALS — BP 109/67 | HR 73 | Temp 98.1°F | Ht 68.0 in | Wt 385.0 lb

## 2021-07-24 DIAGNOSIS — E559 Vitamin D deficiency, unspecified: Secondary | ICD-10-CM

## 2021-07-24 DIAGNOSIS — E1165 Type 2 diabetes mellitus with hyperglycemia: Secondary | ICD-10-CM

## 2021-07-24 DIAGNOSIS — Z6841 Body Mass Index (BMI) 40.0 and over, adult: Secondary | ICD-10-CM

## 2021-07-24 MED ORDER — TRULICITY 1.5 MG/0.5ML ~~LOC~~ SOAJ: 1.5000 mg | SUBCUTANEOUS | 0 refills | Status: DC

## 2021-07-25 MED ORDER — METFORMIN HCL 500 MG PO TABS: 500.0000 mg | ORAL_TABLET | Freq: Every day | ORAL | 0 refills | Status: DC

## 2021-07-25 NOTE — Progress Notes (Signed)
Chief Complaint:   OBESITY James Gross is here to discuss his progress with his obesity treatment plan along with follow-up of his obesity related diagnoses. James Gross is on keeping a food journal and adhering to recommended goals of 1700-1800 calories and 120+ grams protein and states he is following his eating plan approximately 80% of the time. James Gross states he is walking indoors 45 minutes 4-5 times per week.  Today's visit was #: 31 Starting weight: 471 lbs Starting date: 02/28/2020 Today's weight: 385 lbs Today's date: 07/24/2021 Total lbs lost to date: 86 Total lbs lost since last in-office visit: 11  Interim History: Pt is hitting the gym with more lifting 4-5 times a week. Over the last 2 weeks, he went to James Gross every day, which he got for Christmas. Pt denies hunger or cravings. He is meal prepping.  Subjective:   1. Type 2 diabetes mellitus with hyperglycemia, without long-term current use of insulin (HCC) We increased Trulicity from [id number] mg to 1.5 mg in mid December. It helped a lot with cravings and helped pt get back on track after the holidays. Been at 1/2 tab of Metformin daily with lunch.  Assessment/Plan:  No orders of the defined types were placed in this encounter.   Medications Discontinued During This Encounter  Medication Reason   Dulaglutide (TRULICITY) 1.5 0000000 SOPN Reorder   metFORMIN (GLUCOPHAGE) 500 MG tablet Reorder     Meds ordered this encounter  Medications   Dulaglutide (TRULICITY) 1.5 0000000 SOPN    Sig: Inject 1.5 mg into the skin once a week.    Dispense:  2 mL    Refill:  0   metFORMIN (GLUCOPHAGE) 500 MG tablet    Sig: Take 1 tablet (500 mg total) by mouth daily with lunch.    Dispense:  30 tablet    Refill:  0     1. Type 2 diabetes mellitus with hyperglycemia, without long-term current use of insulin (HCC) Good blood sugar control is important to decrease the likelihood of diabetic complications such as nephropathy, neuropathy,  limb loss, blindness, coronary artery disease, and death. Intensive lifestyle modification including diet, exercise and weight loss are the first line of treatment for diabetes. Pt will increase Metformin to 1 full tab daily with lunch.  Refill- Dulaglutide (TRULICITY) 1.5 0000000 SOPN; Inject 1.5 mg into the skin once a week.  Dispense: 2 mL; Refill: 0 Increase & Refill- metFORMIN (GLUCOPHAGE) 500 MG tablet; Take 1 tablet (500 mg total) by mouth daily with lunch.  Dispense: 30 tablet; Refill: 0  2. Obesity with current BMI of 58.7  James Gross is currently in the action stage of change. As such, his goal is to continue with weight loss efforts. He has agreed to keeping a food journal and adhering to recommended goals of 1700-1800 calories and 120+ grams protein.   Continue exercise. Focus on increasing to 1 gallon of water a day for the next 3 weeks.  Exercise goals:  As is  Behavioral modification strategies: increasing water intake and planning for success.  [name] has agreed to follow-up with our clinic in 3 weeks. He was informed of the importance of frequent follow-up visits to maximize his success with intensive lifestyle modifications for his multiple health conditions.   Objective:   Blood pressure 109/67, pulse 73, temperature 98.1 F (36.7 C), height 5\' 8"  (1.727 m), weight (!) 385 lb (174.6 kg), SpO2 (!) 68 %. Body mass index is 58.54 kg/m.  General: Cooperative, alert, well  developed, in no acute distress. HEENT: Conjunctivae and lids unremarkable. Cardiovascular: Regular rhythm.  Lungs: Normal work of breathing. Neurologic: No focal deficits.   Lab Results  Component Value Date   CREATININE 0.83 06/18/2021   BUN 15 06/18/2021   NA 139 06/18/2021   K 4.5 06/18/2021   CL 100 06/18/2021   CO2 25 06/18/2021   Lab Results  Component Value Date   ALT 40 06/18/2021   AST 20 06/18/2021   ALKPHOS 92 06/18/2021   BILITOT 0.3 06/18/2021   Lab Results  Component Value Date    HGBA1C 5.8 (H) 06/18/2021   HGBA1C 5.6 02/18/2021   HGBA1C 5.9 (H) 11/01/2020   HGBA1C 6.0 (H) 06/11/2020   HGBA1C 6.6 (H) 02/28/2020   Lab Results  Component Value Date   INSULIN 35.2 (H) 06/18/2021   INSULIN 26.3 (H) 02/18/2021   INSULIN 34.4 (H) 11/01/2020   INSULIN 30.8 (H) 06/11/2020   INSULIN 80.6 (H) 02/28/2020   Lab Results  Component Value Date   TSH 4.490 06/18/2021   Lab Results  Component Value Date   CHOL 179 02/18/2021   HDL 47 02/18/2021   LDLCALC 118 (H) 02/18/2021   TRIG 75 02/18/2021   CHOLHDL 4.3 11/01/2020   Lab Results  Component Value Date   VD25OH 21.0 (L) 06/18/2021   VD25OH 24.2 (L) 02/18/2021   VD25OH 26.7 (L) 11/01/2020   Lab Results  Component Value Date   WBC 4.6 06/18/2021   HGB 14.0 06/18/2021   HCT 43.0 06/18/2021   MCV 80 06/18/2021   PLT 285 06/18/2021    Attestation Statements:   Reviewed by clinician on day of visit: allergies, medications, problem list, medical history, surgical history, family history, social history, and previous encounter notes.  James Gross, CMA, am acting as transcriptionist for [employer], DO.  I have reviewed the above documentation for accuracy and completeness, and I agree with the above. James Gross, D.O.  The [place] was signed into law in 2016 which includes the topic of electronic health records.  This provides immediate access to information in MyChart.  This includes consultation notes, operative notes, office notes, lab results and pathology reports.  If you have any questions about what you read please let us know at your next visit so we can discuss your concerns and take corrective action if need be.  We are right here with you.

## 2021-08-14 ENCOUNTER — Other Ambulatory Visit: Payer: Self-pay

## 2021-08-14 ENCOUNTER — Ambulatory Visit (INDEPENDENT_AMBULATORY_CARE_PROVIDER_SITE_OTHER): Payer: Medicaid Other | Admitting: Family Medicine

## 2021-08-14 ENCOUNTER — Encounter (INDEPENDENT_AMBULATORY_CARE_PROVIDER_SITE_OTHER): Payer: Self-pay | Admitting: Family Medicine

## 2021-08-14 VITALS — BP 114/70 | HR 73 | Temp 98.4°F | Ht 68.0 in | Wt 384.0 lb

## 2021-08-14 DIAGNOSIS — E1165 Type 2 diabetes mellitus with hyperglycemia: Secondary | ICD-10-CM | POA: Diagnosis not present

## 2021-08-14 DIAGNOSIS — Z6841 Body Mass Index (BMI) 40.0 and over, adult: Secondary | ICD-10-CM

## 2021-08-14 DIAGNOSIS — E559 Vitamin D deficiency, unspecified: Secondary | ICD-10-CM

## 2021-08-14 DIAGNOSIS — E669 Obesity, unspecified: Secondary | ICD-10-CM

## 2021-08-14 DIAGNOSIS — Z7985 Long-term (current) use of injectable non-insulin antidiabetic drugs: Secondary | ICD-10-CM

## 2021-08-14 MED ORDER — TRULICITY 1.5 MG/0.5ML ~~LOC~~ SOAJ: 1.5000 mg | SUBCUTANEOUS | 0 refills | Status: DC

## 2021-08-14 MED ORDER — VITAMIN D (ERGOCALCIFEROL) 1.25 MG (50000 UNIT) PO CAPS: 50000.0000 [IU] | ORAL_CAPSULE | ORAL | 0 refills | Status: DC

## 2021-08-20 NOTE — Progress Notes (Signed)
Chief Complaint:   OBESITY James Gross is here to discuss his progress with his obesity treatment plan along with follow-up of his obesity related diagnoses. James Gross is on keeping a food journal and adhering to recommended goals of 1700-1800 calories and 120+ grams of protein daily and states he is following his eating plan approximately 80% of the time. James Gross states he is walking indoors and doing strength training for 30-40 minutes 5 times per week.  Today's visit was #: 31 Starting weight: 471 lbs Starting date: 02/28/2020 Today's weight: 384 lbs Today's date: 08/14/2021 Total lbs lost to date: 87 Total lbs lost since last in-office visit: 1  Interim History: James Gross is here for a follow up office visit. We reviewed his meal plan and questions were answered. Patient's food recall appears to be accurate and consistent with what is on plan when he is following it. When eating on plan, his hunger and cravings are well controlled. James Gross had a couple of days (4) of not hitting his protein goals, but he never goes over in calories. He is journaling every day. He is going to the gym on Tuesday-Saturday with 30-40 minutes of cardio and 40 minutes of lifting weights.  Subjective:   1. Type 2 diabetes mellitus with hyperglycemia, without long-term current use of insulin (HCC) James Gross takes Trulicity as prescribed. His last A1c was 5.8. He is not taking metformin so no need for a refill.   2. Vitamin D deficiency James Gross is not taking his Vitamin D regularly.   Assessment/Plan:   Medications Discontinued During This Encounter  Medication Reason   Dulaglutide (TRULICITY) 1.5 MG/0.5ML SOPN Reorder   Vitamin D, Ergocalciferol, (DRISDOL) 1.25 MG (50000 UNIT) CAPS capsule Reorder     Meds ordered this encounter  Medications   Dulaglutide (TRULICITY) 1.5 MG/0.5ML SOPN    Sig: Inject 1.5 mg into the skin once a week.    Dispense:  2 mL    Refill:  0   Vitamin D, Ergocalciferol, (DRISDOL) 1.25 MG  (50000 UNIT) CAPS capsule    Sig: Take 1 capsule (50,000 Units total) by mouth every 7 (seven) days.    Dispense:  4 capsule    Refill:  0    30 d supply;  ** OV for RF **   Do not send RF request     1. Type 2 diabetes mellitus with hyperglycemia, without long-term current use of insulin (HCC) We will refill Trulicity at 1.5 mg for 1 month, no change in dose. We may consider increasing if needed. He will continue metformin. Good blood sugar control is important to decrease the likelihood of diabetic complications such as nephropathy, neuropathy, limb loss, blindness, coronary artery disease, and death. Intensive lifestyle modification including diet, exercise and weight loss are the first line of treatment for diabetes.   - Dulaglutide (TRULICITY) 1.5 MG/0.5ML SOPN; Inject 1.5 mg into the skin once a week.  Dispense: 2 mL; Refill: 0  2. Vitamin D deficiency James Gross will continue prescription Vitamin D 50,000 IU every week, and we will refill for 1 month. He will follow-up for routine testing of Vitamin D, at least 2-3 times per year to avoid over-replacement.  - Vitamin D, Ergocalciferol, (DRISDOL) 1.25 MG (50000 UNIT) CAPS capsule; Take 1 capsule (50,000 Units total) by mouth every 7 (seven) days.  Dispense: 4 capsule; Refill: 0  3. Obesity with current BMI of 58.4 James Gross is currently in the action stage of change. As such, his goal is  to continue with weight loss efforts. He has agreed to keeping a food journal and adhering to recommended goals of 1700-1800 calories and 150 grams of protein daily.   James Gross will bring in his log of journaling totals with him to his next office visit. He also would like to increase his minimum amount of protein per day to 150 grams.  Exercise goals: As is. He will discuss weight lifting exercises in further detail with James Gross at his next office visit.  Behavioral modification strategies: keeping a strict food journal.  James Gross has agreed to follow-up with our clinic  in 3 weeks with James Cliche, PA-C. He was informed of the importance of frequent follow-up visits to maximize his success with intensive lifestyle modifications for his multiple health conditions.   Objective:   Blood pressure 114/70, pulse 73, temperature 98.4 F (36.9 C), height 5\' 8"  (1.727 m), weight (!) 384 lb (174.2 kg), SpO2 100 %. Body mass index is 58.39 kg/m.  General: Cooperative, alert, well developed, in no acute distress. HEENT: Conjunctivae and lids unremarkable. Cardiovascular: Regular rhythm.  Lungs: Normal work of breathing. Neurologic: No focal deficits.   Lab Results  Component Value Date   CREATININE 0.83 06/18/2021   BUN 15 06/18/2021   NA 139 06/18/2021   K 4.5 06/18/2021   CL 100 06/18/2021   CO2 25 06/18/2021   Lab Results  Component Value Date   ALT 40 06/18/2021   AST 20 06/18/2021   ALKPHOS 92 06/18/2021   BILITOT 0.3 06/18/2021   Lab Results  Component Value Date   HGBA1C 5.8 (H) 06/18/2021   HGBA1C 5.6 02/18/2021   HGBA1C 5.9 (H) 11/01/2020   HGBA1C 6.0 (H) 06/11/2020   HGBA1C 6.6 (H) 02/28/2020   Lab Results  Component Value Date   INSULIN 35.2 (H) 06/18/2021   INSULIN 26.3 (H) 02/18/2021   INSULIN 34.4 (H) 11/01/2020   INSULIN 30.8 (H) 06/11/2020   INSULIN 80.6 (H) 02/28/2020   Lab Results  Component Value Date   TSH 4.490 06/18/2021   Lab Results  Component Value Date   CHOL 179 02/18/2021   HDL 47 02/18/2021   LDLCALC 118 (H) 02/18/2021   TRIG 75 02/18/2021   CHOLHDL 4.3 11/01/2020   Lab Results  Component Value Date   VD25OH 21.0 (L) 06/18/2021   VD25OH 24.2 (L) 02/18/2021   VD25OH 26.7 (L) 11/01/2020   Lab Results  Component Value Date   WBC 4.6 06/18/2021   HGB 14.0 06/18/2021   HCT 43.0 06/18/2021   MCV 80 06/18/2021   PLT 285 06/18/2021   No results found for: IRON, TIBC, FERRITIN  Attestation Statements:   Reviewed by clinician on day of visit: allergies, medications, problem list, medical history,  surgical history, family history, social history, and previous encounter notes.   06/20/2021, am acting as transcriptionist for Trude Mcburney, DO.  I have reviewed the above documentation for accuracy and completeness, and I agree with the above. [employer], D.O.  The 21st Century Cures Act was signed into law in 2016 which includes the topic of electronic health records.  This provides immediate access to information in MyChart.  This includes consultation notes, operative notes, office notes, lab results and pathology reports.  If you have any questions about what you read please let 2017 know at your next visit so we can discuss your concerns and take corrective action if need be.  We are right here with you.

## 2021-09-05 ENCOUNTER — Encounter (INDEPENDENT_AMBULATORY_CARE_PROVIDER_SITE_OTHER): Payer: Self-pay | Admitting: Physician Assistant

## 2021-09-05 ENCOUNTER — Other Ambulatory Visit: Payer: Self-pay

## 2021-09-05 ENCOUNTER — Ambulatory Visit (INDEPENDENT_AMBULATORY_CARE_PROVIDER_SITE_OTHER): Payer: Medicaid Other | Admitting: Physician Assistant

## 2021-09-05 VITALS — BP 105/66 | HR 87 | Temp 98.3°F | Ht 68.0 in | Wt 382.0 lb

## 2021-09-05 DIAGNOSIS — E669 Obesity, unspecified: Secondary | ICD-10-CM | POA: Diagnosis not present

## 2021-09-05 DIAGNOSIS — E1165 Type 2 diabetes mellitus with hyperglycemia: Secondary | ICD-10-CM | POA: Diagnosis not present

## 2021-09-05 DIAGNOSIS — Z7985 Long-term (current) use of injectable non-insulin antidiabetic drugs: Secondary | ICD-10-CM

## 2021-09-05 DIAGNOSIS — E559 Vitamin D deficiency, unspecified: Secondary | ICD-10-CM

## 2021-09-05 DIAGNOSIS — Z6841 Body Mass Index (BMI) 40.0 and over, adult: Secondary | ICD-10-CM

## 2021-09-05 MED ORDER — TRULICITY 1.5 MG/0.5ML ~~LOC~~ SOAJ: 1.5000 mg | SUBCUTANEOUS | 0 refills | Status: DC

## 2021-09-05 MED ORDER — METFORMIN HCL 500 MG PO TABS: 500.0000 mg | ORAL_TABLET | Freq: Every day | ORAL | 0 refills | Status: DC

## 2021-09-05 MED ORDER — VITAMIN D (ERGOCALCIFEROL) 1.25 MG (50000 UNIT) PO CAPS: 50000.0000 [IU] | ORAL_CAPSULE | ORAL | 0 refills | Status: DC

## 2021-09-05 NOTE — Progress Notes (Signed)
? ? ? ?Chief Complaint:  ? ?OBESITY ?James Gross is here to discuss his progress with his obesity treatment plan along with follow-up of his obesity related diagnoses. James Gross is on keeping a food journal and adhering to recommended goals of 1700-1800 calories and 150 grams of protein and states he is following his eating plan approximately 60% of the time. James Gross states he is waking and weights for 90 minutes 3-4 times per week. ? ?Today's visit was #: 32 ?Starting weight: 471 lbs ?Starting date: 02/28/2020 ?Today's weight: 382 lbs ?Today's date: 09/05/2021 ?Total lbs lost to date: 89 lbs ?Total lbs lost since last in-office visit: 2 lbs ? ?Interim History: James Gross reports celebrating 3 different birthday parties the last few weeks. During the week, he did well with getting 120-150 grams of protein and 1700-1800 calories. He is intermittent fasting and his eating window is 1 pm to 9 pm. He is not eating any vegetables. He is only drinking 64 ounces of water daily.  ? ?Subjective:  ? ?1. Type 2 diabetes mellitus with hyperglycemia, without long-term current use of insulin (HCC) ?James Gross's last A1C was 5.8. He is on Trulicity 1.5 mg and Metformin once daily. His appetite is controlled.  ? ?2. Vitamin D deficiency ?James Gross is on Vitamin D twice weekly and he is taking Vitamin D as prescribed.  ? ?Assessment/Plan:  ? ?1. Type 2 diabetes mellitus with hyperglycemia, without long-term current use of insulin (HCC) ?We will refill Trulicity 1.5 mg for 1 month with no refills. We will refill Metformin 500 mg for 1 month with no refills. Good blood sugar control is important to decrease the likelihood of diabetic complications such as nephropathy, neuropathy, limb loss, blindness, coronary artery disease, and death. Intensive lifestyle modification including diet, exercise and weight loss are the first line of treatment for diabetes.  ? ?- Dulaglutide (TRULICITY) 1.5 MG/0.5ML SOPN; Inject 1.5 mg into the skin once a week.  Dispense: 2 mL; Refill:  0 ?- metFORMIN (GLUCOPHAGE) 500 MG tablet; Take 1 tablet (500 mg total) by mouth daily with lunch.  Dispense: 30 tablet; Refill: 0 ? ?2. Vitamin D deficiency ?Low Vitamin D level contributes to fatigue and are associated with obesity, breast, and colon cancer. We will refill prescription Vitamin D 50,000 IU twice week for 1 month with no refills and James Gross will follow-up for routine testing of Vitamin D, at least 2-3 times per year to avoid over-replacement. ? ?- Vitamin D, Ergocalciferol, (DRISDOL) 1.25 MG (50000 UNIT) CAPS capsule; Take 1 capsule (50,000 Units total) by mouth 2 (two) times a week.  Dispense: 10 capsule; Refill: 0 ? ?3. Obesity with current BMI of 58.4 ?James Gross is currently in the action stage of change. As such, his goal is to continue with weight loss efforts. He has agreed to keeping a food journal and adhering to recommended goals of 1700-1800 calories and 90 grams of protein.  ? ?Exercise goals:  As is.  ? ?Behavioral modification strategies: meal planning and cooking strategies and keeping healthy foods in the home. ? ?James Gross has agreed to follow-up with our clinic in 2 weeks. He was informed of the importance of frequent follow-up visits to maximize his success with intensive lifestyle modifications for his multiple health conditions.  ? ?Objective:  ? ?Blood pressure 105/66, pulse 87, temperature 98.3 ?F (36.8 ?C), height 5\' 8"  (1.727 m), weight (!) 382 lb (173.3 kg), SpO2 (!) 87 %. ?Body mass index is 58.08 kg/m?. ? ?General: Cooperative, alert, well developed, in no acute  distress. ?HEENT: Conjunctivae and lids unremarkable. ?Cardiovascular: Regular rhythm.  ?Lungs: Normal work of breathing. ?Neurologic: No focal deficits.  ? ?Lab Results  ?Component Value Date  ? CREATININE 0.83 06/18/2021  ? BUN 15 06/18/2021  ? NA 139 06/18/2021  ? K 4.5 06/18/2021  ? CL 100 06/18/2021  ? CO2 25 06/18/2021  ? ?Lab Results  ?Component Value Date  ? ALT 40 06/18/2021  ? AST 20 06/18/2021  ? ALKPHOS 92  06/18/2021  ? BILITOT 0.3 06/18/2021  ? ?Lab Results  ?Component Value Date  ? HGBA1C 5.8 (H) 06/18/2021  ? HGBA1C 5.6 02/18/2021  ? HGBA1C 5.9 (H) 11/01/2020  ? HGBA1C 6.0 (H) 06/11/2020  ? HGBA1C 6.6 (H) 02/28/2020  ? ?Lab Results  ?Component Value Date  ? INSULIN 35.2 (H) 06/18/2021  ? INSULIN 26.3 (H) 02/18/2021  ? INSULIN 34.4 (H) 11/01/2020  ? INSULIN 30.8 (H) 06/11/2020  ? INSULIN 80.6 (H) 02/28/2020  ? ?Lab Results  ?Component Value Date  ? TSH 4.490 06/18/2021  ? ?Lab Results  ?Component Value Date  ? CHOL 179 02/18/2021  ? HDL 47 02/18/2021  ? LDLCALC 118 (H) 02/18/2021  ? TRIG 75 02/18/2021  ? CHOLHDL 4.3 11/01/2020  ? ?Lab Results  ?Component Value Date  ? VD25OH 21.0 (L) 06/18/2021  ? VD25OH 24.2 (L) 02/18/2021  ? VD25OH 26.7 (L) 11/01/2020  ? ?Lab Results  ?Component Value Date  ? WBC 4.6 06/18/2021  ? HGB 14.0 06/18/2021  ? HCT 43.0 06/18/2021  ? MCV 80 06/18/2021  ? PLT 285 06/18/2021  ? ?No results found for: IRON, TIBC, FERRITIN ? ?Attestation Statements:  ? ?Reviewed by clinician on day of visit: allergies, medications, problem list, medical history, surgical history, family history, social history, and previous encounter notes. ? ?I, Sindy Messing, am acting as Energy manager for [employer], PA-C. ? ?I have reviewed the above documentation for accuracy and completeness, and I agree with the above. Alois Cliche, PA-C ? ? ?

## 2021-09-06 ENCOUNTER — Other Ambulatory Visit: Payer: Self-pay

## 2021-09-06 ENCOUNTER — Emergency Department (HOSPITAL_COMMUNITY)
Admission: EM | Admit: 2021-09-06 | Discharge: 2021-09-06 | Disposition: A | Discharge status: Home / Self Care | Payer: Medicaid Other | Attending: Emergency Medicine | Admitting: Emergency Medicine

## 2021-09-06 ENCOUNTER — Emergency Department (HOSPITAL_COMMUNITY): Payer: Medicaid Other

## 2021-09-06 ENCOUNTER — Encounter (HOSPITAL_COMMUNITY): Payer: Self-pay | Admitting: *Deleted

## 2021-09-06 DIAGNOSIS — S20319A Abrasion of unspecified front wall of thorax, initial encounter: Secondary | ICD-10-CM | POA: Diagnosis not present

## 2021-09-06 DIAGNOSIS — S299XXA Unspecified injury of thorax, initial encounter: Secondary | ICD-10-CM | POA: Diagnosis present

## 2021-09-06 DIAGNOSIS — Y92410 Unspecified street and highway as the place of occurrence of the external cause: Secondary | ICD-10-CM | POA: Diagnosis not present

## 2021-09-06 DIAGNOSIS — R109 Unspecified abdominal pain: Secondary | ICD-10-CM | POA: Insufficient documentation
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
## 2021-09-06 NOTE — ED Notes (Signed)
Provider at [facility] ?

## 2021-09-06 NOTE — Discharge Instructions (Signed)
Please come back to ER if you develop difficulty in breathing, abdominal pain, vomiting or other new concerning symptom. ? ?Take Tylenol or Motrin as needed for pain. ?
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
## 2021-09-19 ENCOUNTER — Ambulatory Visit (INDEPENDENT_AMBULATORY_CARE_PROVIDER_SITE_OTHER): Payer: Medicaid Other | Admitting: Family Medicine

## 2021-10-10 ENCOUNTER — Ambulatory Visit (INDEPENDENT_AMBULATORY_CARE_PROVIDER_SITE_OTHER): Payer: Medicaid Other | Admitting: Physician Assistant

## 2021-10-10 VITALS — BP 120/80 | HR 90 | Temp 97.9°F | Ht 68.0 in | Wt 392.0 lb

## 2021-10-10 DIAGNOSIS — Z6841 Body Mass Index (BMI) 40.0 and over, adult: Secondary | ICD-10-CM

## 2021-10-10 DIAGNOSIS — Z7985 Long-term (current) use of injectable non-insulin antidiabetic drugs: Secondary | ICD-10-CM | POA: Diagnosis not present

## 2021-10-10 DIAGNOSIS — E669 Obesity, unspecified: Secondary | ICD-10-CM

## 2021-10-10 DIAGNOSIS — E1165 Type 2 diabetes mellitus with hyperglycemia: Secondary | ICD-10-CM
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
## 2021-10-16 NOTE — Progress Notes (Signed)
HPI ?M never smoker followed for OSA, complicated by Morbid Obesity, PreDiabetes ?NPSG 03/27/19  AHI 169.9/ hr, desaturation to 66%, Body weight 430 lbs, CPAP titrated to 16 ? ?===================================================================== ? ?10/15/20- 19 yoM never smoker followed for OSA, complicated by Morbid Obesity, DM2,  ?CPAP auto 12-18/ [facility] ?Download- compliance 27%, AHI 1.2/ hr ?Body weight today- 408lbs (down 34) ?Covid vax-2 Moderna ?Flu vax-had ?-----No current respiratory concerns.  ?Says he falls asleep before putting mask on. Discussed need for regular, careful use of CPAP. Comfortable with FFM.  ? ?10/17/21- 20 yoM never smoker followed for OSA, complicated by Morbid Obesity, DM2,  ?CPAP auto 12-18/ [facility] ?Download- compliance 20%, AHI 0.4/ hr ?Body weight today- 407 lbs ?Covid vax-2 Moderna ?Flu vax-no ?Patient is doing good, no concerns ?He continues in school with 2 more years studying business at [employer]. ?Download reviewed.  He admits he often looks at his phone until he falls asleep before putting CPAP on.  We discussed sleep habits and management.  CPAP is clearly the best approach for him and I strongly encouraged him to work for better compliance. ?Unfortunately he has not made any sustained progress with weight loss. ?CXR- 09/06/21- ?IMPRESSION: ?No acute cardiopulmonary abnormalities. ? ? ?ROS-see HPI   + = positive ?Constitutional:    +diet/weight loss, night sweats, fevers, chills, fatigue, lassitude. ?HEENT:    headaches, difficulty swallowing, tooth/dental problems, sore throat,  ?     sneezing, itching, ear ache, nasal congestion, post nasal drip, snoring ?CV:    chest pain, orthopnea, PND, swelling in lower extremities, anasarca,                                   ?dizziness, palpitations ?Resp:   shortness of breath with exertion or at rest.   ?             productive cough,   non-productive cough, coughing up of blood.   ?           change in color of mucus.  wheezing.   ?Skin:     rash or lesions. ?GI:  No-   heartburn, indigestion, abdominal pain, nausea, vomiting, diarrhea,  ?               change in bowel habits, loss of appetite ?GU: dysuria, change in color of urine, no urgency or frequency.   flank pain. ?MS:   joint pain, stiffness, decreased range of motion, back pain. ?Neuro-     nothing unusual ?Psych:  change in mood or affect.  depression or anxiety.   memory loss. ? ?OBJ- Physical Exam ?General- Alert, Oriented, Affect-appropriate, Distress- none acute, + morbid obesity ?Skin- rash-none, lesions- none, excoriation- none ?Lymphadenopathy- none ?Head- atraumatic ?           Eyes- Gross vision intact, PERRLA, conjunctivae and secretions clear ?           Ears- Hearing, canals-normal ?           Nose- Clear, no-Septal dev, mucus, polyps, erosion, perforation  ?           Throat- Mallampati III-IV , mucosa clear , drainage- none, tonsils+, + teeth ?Neck- flexible , trachea midline, no stridor , thyroid nl, carotid no bruit ?Chest - symmetrical excursion , unlabored ?          Heart/CV- RRR , no murmur , no gallop  , no rub, nl s1  s2 ?                          - JVD- none , edema- none, stasis changes- none, varices- none ?          Lung- clear to P&A, wheeze- none, cough- none , dullness-none, rub- none ?          Chest wall-  ?Abd-  ?Br/ Gen/ Rectal- Not done, not indicated ?Extrem- cyanosis- none, clubbing, none, atrophy- none, strength- nl ?Neuro- grossly intact to observation ? ? ?

## 2021-10-17 ENCOUNTER — Ambulatory Visit (INDEPENDENT_AMBULATORY_CARE_PROVIDER_SITE_OTHER): Payer: Medicaid Other | Admitting: Internal Medicine

## 2021-10-17 ENCOUNTER — Encounter: Payer: Self-pay | Admitting: Internal Medicine

## 2021-10-17 DIAGNOSIS — Z6841 Body Mass Index (BMI) 40.0 and over, adult: Secondary | ICD-10-CM | POA: Diagnosis not present

## 2021-10-17 DIAGNOSIS — G4733 Obstructive sleep apnea (adult) (pediatric): Secondary | ICD-10-CM

## 2021-10-17 NOTE — Assessment & Plan Note (Signed)
Benefits from CPAP with good control when used.  Needs to work on compliance and this was emphasized.  Comfort measures discussed.  With his severe apnea and morbid obesity, CPAP is the best treatment. ?

## 2021-10-17 NOTE — Assessment & Plan Note (Signed)
He will likely need external help and support to achieve a meaningful lifestyle change that would accomplish weight loss.  Diet and exercise contribute. ?

## 2021-10-17 NOTE — Patient Instructions (Signed)
Try to get in the habit of putting your CPAP on every night before you fall asleep. It will help you to wear it.  ? ?Keep working on your weight. ? ?God luck with school. Please call if we can help ?

## 2021-10-31 ENCOUNTER — Ambulatory Visit (INDEPENDENT_AMBULATORY_CARE_PROVIDER_SITE_OTHER): Payer: Medicaid Other | Admitting: Physician Assistant

## 2021-11-07 ENCOUNTER — Ambulatory Visit (INDEPENDENT_AMBULATORY_CARE_PROVIDER_SITE_OTHER): Payer: Medicaid Other | Admitting: Adult Health

## 2021-11-27 ENCOUNTER — Ambulatory Visit (INDEPENDENT_AMBULATORY_CARE_PROVIDER_SITE_OTHER): Payer: Medicaid Other | Admitting: Family Medicine

## 2021-11-27 ENCOUNTER — Encounter (INDEPENDENT_AMBULATORY_CARE_PROVIDER_SITE_OTHER): Payer: Self-pay | Admitting: Family Medicine

## 2021-11-27 VITALS — BP 105/66 | HR 73 | Temp 98.2°F | Ht 68.0 in | Wt >= 6400 oz

## 2021-11-27 DIAGNOSIS — E1169 Type 2 diabetes mellitus with other specified complication: Secondary | ICD-10-CM

## 2021-11-27 DIAGNOSIS — E538 Deficiency of other specified B group vitamins: Secondary | ICD-10-CM

## 2021-11-27 DIAGNOSIS — E669 Obesity, unspecified: Secondary | ICD-10-CM | POA: Diagnosis not present

## 2021-11-27 DIAGNOSIS — Z7984 Long term (current) use of oral hypoglycemic drugs: Secondary | ICD-10-CM

## 2021-11-27 DIAGNOSIS — Z6841 Body Mass Index (BMI) 40.0 and over, adult: Secondary | ICD-10-CM

## 2021-11-27 DIAGNOSIS — E559 Vitamin D deficiency, unspecified: Secondary | ICD-10-CM

## 2021-12-06 NOTE — Progress Notes (Signed)
Chief Complaint:   OBESITY James Gross is here to discuss his progress with his obesity treatment plan along with follow-up of his obesity related diagnoses. James Gross is on keeping a food journal and adhering to recommended goals of 1700-1800 calories and 130 grams protein and states he is following his eating plan approximately 0% of the time. James Gross states he is not currently exercising.  Today's visit was #: 34 Starting weight: 471 lbs Starting date: 02/28/2020 Today's weight: 405 lbs Today's date: 11/27/2021 Total lbs lost to date: 66 Total lbs lost since last in-office visit: 13  Interim History: For the last month and a half, James Gross was in [place] taking care of relative and not himself. Her has not skipped meal but ate off plan more than 50% of the time. He also went off all of his meds.  Subjective:   1. Diabetes mellitus type 2 in obese (HCC) James Gross was taking Trulicity and Metformin, but his last doses were over 2 months ago. He does not check his blood sugar at home. His last A1c was 5.8 and fasting insulin was 35.2 on 06/08/2021.  2. Vitamin D deficiency Pt has not take Vit D supplement in over 2 months. His last Vit D level was 21.0 on 06/18/2021.  3. Vitamin B 12 deficiency James Gross's last B12 level was 386 in 06/2021. He has not been on any supplements, other than B12 rich foods.  Assessment/Plan:  No orders of the defined types were placed in this encounter.   There are no discontinued medications.   No orders of the defined types were placed in this encounter.    1. Diabetes mellitus type 2 in obese (HCC) Good blood sugar control is important to decrease the likelihood of diabetic complications such as nephropathy, neuropathy, limb loss, blindness, coronary artery disease, and death. Intensive lifestyle modification including diet, exercise and weight loss are the first line of treatment for diabetes. Restart Trulicity and Metformin daily. Eat prudent nutritional plan and cut  out simple carbs. Return for fasting labs prior to next OV.  2. Vitamin D deficiency Low Vitamin D level contributes to fatigue and are associated with obesity, breast, and colon cancer. He agrees to restart prescription Vitamin D @50 ,000 IU every week and will follow-up for routine testing of Vitamin D, at least 2-3 times per year to avoid over-replacement. Pt denies need for refill. Counseling on Vit D deficiency done. Pt is to return to clinic prior to next OV for labs.  3. Vitamin B 12 deficiency The diagnosis was reviewed with the patient. Counseling provided today, see below. We will continue to monitor. Orders and follow up as documented in patient record. Pt is to return to clinic prior to next OV for labs.  Counseling The body needs vitamin B12: to make red blood cells; to make DNA; and to help the nerves work properly so they can carry messages from the brain to the body.  The main causes of vitamin B12 deficiency include dietary deficiency, digestive diseases, pernicious anemia, and having a surgery in which part of the stomach or small intestine is removed.  Certain medicines can make it harder for the body to absorb vitamin B12. These medicines include: heartburn medications; some antibiotics; some medications used to treat diabetes, gout, and high cholesterol.  In some cases, there are no symptoms of this condition. If the condition leads to anemia or nerve damage, various symptoms can occur, such as weakness or fatigue, shortness of breath, and numbness or  tingling in your hands and feet.   Treatment:  May include taking vitamin B12 supplements.  Avoid alcohol.  Eat lots of healthy foods that contain vitamin B12: Beef, pork, chicken, [place], and organ meats, such as liver.  Seafood: This includes clams, rainbow trout, salmon, tuna, and haddock. Eggs.  Cereal and dairy products that are fortified: This means that vitamin B12 has been added to the food.   4. Obesity with current BMI  of 61.6 James Gross is currently in the action stage of change. As such, his goal is to continue with weight loss efforts. He has agreed to change to keeping a food journal and adhering to recommended goals of 1700-1800 calories and 180++ grams protein.   Bring in log of food totals and keep strict food journal, especially when eating off plan.  Exercise goals:  Pt is unable to exercise at this time due to pain.  Behavioral modification strategies: increasing lean protein intake, decreasing simple carbohydrates, and keeping a strict food journal.  James Gross has agreed to follow-up with our clinic in 2 weeks. He is to come in prior to his next OV for fasting blood work. He was informed of the importance of frequent follow-up visits to maximize his success with intensive lifestyle modifications for his multiple health conditions.   James Gross was informed we would discuss his lab results at his next visit unless there is a critical issue that needs to be addressed sooner. James Gross agreed to keep his next visit at the agreed upon time to discuss these results.  Objective:   Blood pressure 105/66, pulse 73, temperature 98.2 F (36.8 C), height 5\' 8"  (1.727 m), weight (!) 405 lb (183.7 kg), SpO2 97 %. Body mass index is 61.58 kg/m.  General: Cooperative, alert, well developed, in no acute distress. HEENT: Conjunctivae and lids unremarkable. Cardiovascular: Regular rhythm.  Lungs: Normal work of breathing. Neurologic: No focal deficits.   Lab Results  Component Value Date   CREATININE 0.83 06/18/2021   BUN 15 06/18/2021   NA 139 06/18/2021   K 4.5 06/18/2021   CL 100 06/18/2021   CO2 25 06/18/2021   Lab Results  Component Value Date   ALT 40 06/18/2021   AST 20 06/18/2021   ALKPHOS 92 06/18/2021   BILITOT 0.3 06/18/2021   Lab Results  Component Value Date   HGBA1C 5.8 (H) 06/18/2021   HGBA1C 5.6 02/18/2021   HGBA1C 5.9 (H) 11/01/2020   HGBA1C 6.0 (H) 06/11/2020   HGBA1C 6.6 (H) 02/28/2020   Lab  Results  Component Value Date   INSULIN 35.2 (H) 06/18/2021   INSULIN 26.3 (H) 02/18/2021   INSULIN 34.4 (H) 11/01/2020   INSULIN 30.8 (H) 06/11/2020   INSULIN 80.6 (H) 02/28/2020   Lab Results  Component Value Date   TSH 4.490 06/18/2021   Lab Results  Component Value Date   CHOL 179 02/18/2021   HDL 47 02/18/2021   LDLCALC 118 (H) 02/18/2021   TRIG 75 02/18/2021   CHOLHDL 4.3 11/01/2020   Lab Results  Component Value Date   VD25OH 21.0 (L) 06/18/2021   VD25OH 24.2 (L) 02/18/2021   VD25OH 26.7 (L) 11/01/2020   Lab Results  Component Value Date   WBC 4.6 06/18/2021   HGB 14.0 06/18/2021   HCT 43.0 06/18/2021   MCV 80 06/18/2021   PLT 285 06/18/2021   Attestation Statements:   Reviewed by clinician on day of visit: allergies, medications, problem list, medical history, surgical history, family history, social history, and  previous encounter notes.  I, Kyung Rudd, BS, CMA, am acting as transcriptionist for [employer], DO.  I have reviewed the above documentation for accuracy and completeness, and I agree with the above. Carlye Grippe, D.O.  The 21st Century Cures Act was signed into law in 2016 which includes the topic of electronic health records.  This provides immediate access to information in MyChart.  This includes consultation notes, operative notes, office notes, lab results and pathology reports.  If you have any questions about what you read please let us know at your next visit so we can discuss your concerns and take corrective action if need be.  We are right here with you.

## 2021-12-11 ENCOUNTER — Ambulatory Visit (INDEPENDENT_AMBULATORY_CARE_PROVIDER_SITE_OTHER): Payer: Medicaid Other | Admitting: Family Medicine

## 2021-12-11 ENCOUNTER — Encounter (INDEPENDENT_AMBULATORY_CARE_PROVIDER_SITE_OTHER): Payer: Self-pay | Admitting: Family Medicine

## 2021-12-11 VITALS — BP 128/75 | HR 89 | Temp 98.7°F | Ht 68.0 in | Wt >= 6400 oz

## 2021-12-11 DIAGNOSIS — E669 Obesity, unspecified: Secondary | ICD-10-CM

## 2021-12-11 DIAGNOSIS — E1169 Type 2 diabetes mellitus with other specified complication: Secondary | ICD-10-CM

## 2021-12-11 DIAGNOSIS — E1069 Type 1 diabetes mellitus with other specified complication: Secondary | ICD-10-CM

## 2021-12-11 DIAGNOSIS — E538 Deficiency of other specified B group vitamins: Secondary | ICD-10-CM | POA: Insufficient documentation

## 2021-12-11 DIAGNOSIS — E559 Vitamin D deficiency, unspecified: Secondary | ICD-10-CM | POA: Diagnosis not present

## 2021-12-11 DIAGNOSIS — Z6841 Body Mass Index (BMI) 40.0 and over, adult: Secondary | ICD-10-CM

## 2021-12-12 LAB — HEMOGLOBIN A1C
Est. average glucose Bld gHb Est-mCnc: 114 mg/dL
Hgb A1c MFr Bld: 5.6 % (ref 4.8–5.6)

## 2021-12-12 LAB — VITAMIN D 25 HYDROXY (VIT D DEFICIENCY, FRACTURES): Vit D, 25-Hydroxy: 16.5 ng/mL — ABNORMAL LOW (ref 30.0–100.0)

## 2021-12-12 LAB — VITAMIN B12: Vitamin B-12: 433 pg/mL (ref 232–1245)

## 2021-12-17 NOTE — Progress Notes (Signed)
Chief Complaint:   OBESITY James Gross is here to discuss his progress with his obesity treatment plan along with follow-up of his obesity related diagnoses. [name] is on keeping a food journal and adhering to recommended goals of 1700-1800 calories and 180+ grams protein and states he is following his eating plan approximately 40% of the time. [name] states he is not currently exercising.  Today's visit was #: 35 Starting weight: 471 lbs Starting date: 02/28/2020 Today's weight: 408 lbs Today's date: 12/11/2021 Total lbs lost to date: 63 Total lbs lost since last in-office visit: +3  Interim History: James Gross journaled for 5 days and was on vacation prior. He learned he's "eating too many carb focused foods without value and not enough protein." Pt is off all of his meds.  Subjective:   1. Type 1 diabetes mellitus with other specified complication (HCC) Ladarian has been off his GLP-1 and Metformin for 3-4 months. His wellbeing has not been at the forefront of his mind.  2. Vitamin D deficiency He last took Ergocalciferol 3-4 months ago.  3. Vitamin B 12 deficiency Pt has been following B12 rich meal plan. His was 386 when last checked 5 months ago.  Assessment/Plan:   Orders Placed This Encounter  Procedures   VITAMIN D 25 Hydroxy (Vit-D Deficiency, Fractures)   Hemoglobin A1c   Vitamin B12    There are no discontinued medications.   No orders of the defined types were placed in this encounter.    1. Type 1 diabetes mellitus with other specified complication (HCC) James Gross will continue to work on weight loss, exercise, and decreasing simple carbohydrates to help decrease the risk of diabetes. Restart Trulicity every Thursday. Pt will consider restarting Metformin in the near future. Check labs.  - Hemoglobin A1c  2. Vitamin D deficiency Low Vitamin D level contributes to fatigue and are associated with obesity, breast, and colon cancer. He agrees to restart prescription Vitamin D  @50 ,000 IU on Thursdays and Mondays and will follow-up for routine testing of Vitamin D, at least 2-3 times per year to avoid over-replacement. Check labs today.  - VITAMIN D 25 Hydroxy (Vit-D Deficiency, Fractures)  3. Vitamin B 12 deficiency .The diagnosis was reviewed with the patient. Counseling provided today, see below. We will continue to monitor. Orders and follow up as documented in patient record.  Counseling The body needs vitamin B12: to make red blood cells; to make DNA; and to help the nerves work properly so they can carry messages from the brain to the body.  The main causes of vitamin B12 deficiency include dietary deficiency, digestive diseases, pernicious anemia, and having a surgery in which part of the stomach or small intestine is removed.  Certain medicines can make it harder for the body to absorb vitamin B12. These medicines include: heartburn medications; some antibiotics; some medications used to treat diabetes, gout, and high cholesterol.  In some cases, there are no symptoms of this condition. If the condition leads to anemia or nerve damage, various symptoms can occur, such as weakness or fatigue, shortness of breath, and numbness or tingling in your hands and feet.   Treatment:  May include taking vitamin B12 supplements.  Avoid alcohol.  Eat lots of healthy foods that contain vitamin B12: Beef, pork, chicken, 10-03-1974, and organ meats, such as liver.  Seafood: This includes clams, rainbow trout, salmon, tuna, and haddock. Eggs.  Cereal and dairy products that are fortified: This means that vitamin B12 has been added  to the food.  Check labs today.  - Vitamin B12  4. Obesity, Current BMI 62.0 James Gross is currently in the action stage of change. As such, his goal is to continue with weight loss efforts. He has agreed to keeping a food journal and adhering to recommended goals of 1700-1800 calories and 180+ grams protein.   Pt's goals: Call Aunt and get back to the  gym with her twice a week. Restart meds. Continue to journal each day. Drink 100 oz of water daily.  Exercise goals: All adults should avoid inactivity. Some physical activity is better than none, and adults who participate in any amount of physical activity gain some health benefits.  Behavioral modification strategies: meal planning and cooking strategies and keeping a strict food journal.  Crystal has agreed to follow-up with our clinic in 2 weeks. He was informed of the importance of frequent follow-up visits to maximize his success with intensive lifestyle modifications for his multiple health conditions.   [name] was informed we would discuss his lab results at his next visit unless there is a critical issue that needs to be addressed sooner. James Gross agreed to keep his next visit at the agreed upon time to discuss these results.  Objective:   Blood pressure 128/75, pulse 89, temperature 98.7 F (37.1 C), height 5\' 8"  (1.727 m), weight (!) 408 lb (185.1 kg), SpO2 97 %. Body mass index is 62.04 kg/m.  General: Cooperative, alert, well developed, in no acute distress. HEENT: Conjunctivae and lids unremarkable. Cardiovascular: Regular rhythm.  Lungs: Normal work of breathing. Neurologic: No focal deficits.   Lab Results  Component Value Date   CREATININE 0.83 06/18/2021   BUN 15 06/18/2021   NA 139 06/18/2021   K 4.5 06/18/2021   CL 100 06/18/2021   CO2 25 06/18/2021   Lab Results  Component Value Date   ALT 40 06/18/2021   AST 20 06/18/2021   ALKPHOS 92 06/18/2021   BILITOT 0.3 06/18/2021   Lab Results  Component Value Date   HGBA1C 5.6 12/11/2021   HGBA1C 5.8 (H) 06/18/2021   HGBA1C 5.6 02/18/2021   HGBA1C 5.9 (H) 11/01/2020   HGBA1C 6.0 (H) 06/11/2020   Lab Results  Component Value Date   INSULIN 35.2 (H) 06/18/2021   INSULIN 26.3 (H) 02/18/2021   INSULIN 34.4 (H) 11/01/2020   INSULIN 30.8 (H) 06/11/2020   INSULIN 80.6 (H) 02/28/2020   Lab Results  Component  Value Date   TSH 4.490 06/18/2021   Lab Results  Component Value Date   CHOL 179 02/18/2021   HDL 47 02/18/2021   LDLCALC 118 (H) 02/18/2021   TRIG 75 02/18/2021   CHOLHDL 4.3 11/01/2020   Lab Results  Component Value Date   VD25OH 16.5 (L) 12/11/2021   VD25OH 21.0 (L) 06/18/2021   VD25OH 24.2 (L) 02/18/2021   Lab Results  Component Value Date   WBC 4.6 06/18/2021   HGB 14.0 06/18/2021   HCT 43.0 06/18/2021   MCV 80 06/18/2021   PLT 285 06/18/2021     Attestation Statements:   Reviewed by clinician on day of visit: allergies, medications, problem list, medical history, surgical history, family history, social history, and previous encounter notes.  I, 06/20/2021, BS, CMA, am acting as transcriptionist for Kyung Rudd, DO.  I have reviewed the above documentation for accuracy and completeness, and I agree with the above. [employer], D.O.  The 21st Century Cures Act was signed into law in 2016 which  includes the topic of electronic health records.  This provides immediate access to information in MyChart.  This includes consultation notes, operative notes, office notes, lab results and pathology reports.  If you have any questions about what you read please let us know at your next visit so we can discuss your concerns and take corrective action if need be.  We are right here with you.

## 2021-12-25 ENCOUNTER — Ambulatory Visit (INDEPENDENT_AMBULATORY_CARE_PROVIDER_SITE_OTHER): Payer: Medicaid Other | Admitting: Family Medicine

## 2021-12-25 ENCOUNTER — Encounter (INDEPENDENT_AMBULATORY_CARE_PROVIDER_SITE_OTHER): Payer: Self-pay

## 2021-12-25 ENCOUNTER — Encounter (INDEPENDENT_AMBULATORY_CARE_PROVIDER_SITE_OTHER): Payer: Self-pay | Admitting: Family Medicine

## 2021-12-25 VITALS — BP 132/76 | HR 87 | Temp 98.2°F | Ht 68.0 in | Wt >= 6400 oz

## 2021-12-25 DIAGNOSIS — E559 Vitamin D deficiency, unspecified: Secondary | ICD-10-CM

## 2021-12-25 DIAGNOSIS — Z6841 Body Mass Index (BMI) 40.0 and over, adult: Secondary | ICD-10-CM

## 2021-12-25 DIAGNOSIS — E669 Obesity, unspecified: Secondary | ICD-10-CM

## 2021-12-25 DIAGNOSIS — E1169 Type 2 diabetes mellitus with other specified complication: Secondary | ICD-10-CM | POA: Diagnosis not present

## 2021-12-25 DIAGNOSIS — E538 Deficiency of other specified B group vitamins: Secondary | ICD-10-CM

## 2021-12-25 DIAGNOSIS — Z7984 Long term (current) use of oral hypoglycemic drugs: Secondary | ICD-10-CM

## 2021-12-25 DIAGNOSIS — E1165 Type 2 diabetes mellitus with hyperglycemia: Secondary | ICD-10-CM

## 2021-12-25 MED ORDER — CYANOCOBALAMIN 500 MCG PO TABS: ORAL_TABLET | ORAL | Status: DC

## 2022-01-09 ENCOUNTER — Ambulatory Visit (INDEPENDENT_AMBULATORY_CARE_PROVIDER_SITE_OTHER): Payer: Medicaid Other | Admitting: Family Medicine

## 2022-01-23 ENCOUNTER — Ambulatory Visit (INDEPENDENT_AMBULATORY_CARE_PROVIDER_SITE_OTHER): Payer: Medicaid Other | Admitting: Family Medicine

## 2022-01-23 ENCOUNTER — Encounter (INDEPENDENT_AMBULATORY_CARE_PROVIDER_SITE_OTHER): Payer: Self-pay | Admitting: Family Medicine

## 2022-01-23 VITALS — BP 117/86 | HR 88 | Temp 98.2°F | Ht 68.0 in | Wt >= 6400 oz

## 2022-01-23 DIAGNOSIS — E559 Vitamin D deficiency, unspecified: Secondary | ICD-10-CM | POA: Diagnosis not present

## 2022-01-23 DIAGNOSIS — E669 Obesity, unspecified: Secondary | ICD-10-CM

## 2022-01-23 DIAGNOSIS — E1165 Type 2 diabetes mellitus with hyperglycemia: Secondary | ICD-10-CM

## 2022-01-23 DIAGNOSIS — Z7984 Long term (current) use of oral hypoglycemic drugs: Secondary | ICD-10-CM

## 2022-01-23 DIAGNOSIS — E538 Deficiency of other specified B group vitamins: Secondary | ICD-10-CM | POA: Diagnosis not present

## 2022-01-23 DIAGNOSIS — Z6841 Body Mass Index (BMI) 40.0 and over, adult: Secondary | ICD-10-CM

## 2022-01-23 MED ORDER — METFORMIN HCL 500 MG PO TABS: 500.0000 mg | ORAL_TABLET | Freq: Every day | ORAL | 0 refills | Status: DC

## 2022-01-23 MED ORDER — CYANOCOBALAMIN 500 MCG PO TABS: ORAL_TABLET | ORAL | Status: DC

## 2022-01-23 MED ORDER — VITAMIN D (ERGOCALCIFEROL) 1.25 MG (50000 UNIT) PO CAPS: 50000.0000 [IU] | ORAL_CAPSULE | ORAL | 0 refills | Status: DC

## 2022-01-27 NOTE — Progress Notes (Signed)
Chief Complaint:   OBESITY James Gross is here to discuss his progress with his obesity treatment plan along with follow-up of his obesity related diagnoses. James Gross is on keeping a food journal and adhering to recommended goals of 1700-1800 calories and 180+ grams protein and states he is following his eating plan approximately 0% of the time. James Gross states he is swimming 60 minutes 2 times per week.  Today's visit was #: 37 Starting weight: 471 lbs Starting date: 02/28/2020 Today's weight: 413 lbs Today's date: 01/23/2022 Total lbs lost to date: 58 Total lbs lost since last in-office visit: 1  Interim History: James Gross did some celebration eating over the 4th of July. He had his wisdom teeth extracted a week ago and was only able to eat ice cream, therefore his protein intake was very low. Pt is mad that he's been the same weight or up in weight since 4/22.  Subjective:   1. Type 2 diabetes mellitus with hyperglycemia, without long-term current use of insulin (HCC) Rajendra has not been taking his Metformin or Trulicity since, at least, July 4th weekend or before.  2. Vitamin D deficiency He has not been consistent with supplement use again. Pt's last Vit D level was a month ago at 16.5 and not at goal.  3. Vitamin B 12 deficiency Pt was suppose to start B12 supplement after his last OV, but he forgot about it, therefore he has not started it yet.  Assessment/Plan:  No orders of the defined types were placed in this encounter.   Medications Discontinued During This Encounter  Medication Reason   Vitamin D, Ergocalciferol, (DRISDOL) 1.25 MG (50000 UNIT) CAPS capsule Reorder   metFORMIN (GLUCOPHAGE) 500 MG tablet Reorder   vitamin B-12 (CYANOCOBALAMIN) 500 MCG tablet Reorder     Meds ordered this encounter  Medications   Vitamin D, Ergocalciferol, (DRISDOL) 1.25 MG (50000 UNIT) CAPS capsule    Sig: Take 1 capsule (50,000 Units total) by mouth 2 (two) times a week.    Dispense:  10 capsule     Refill:  0    30 d supply;  ** OV for RF **   Do not send RF request   metFORMIN (GLUCOPHAGE) 500 MG tablet    Sig: Take 1 tablet (500 mg total) by mouth daily with lunch.    Dispense:  30 tablet    Refill:  0   cyanocobalamin (CYANOCOBALAMIN) 500 MCG tablet    Sig: 300-500 mcg po qd OTC     1. Type 2 diabetes mellitus with hyperglycemia, without long-term current use of insulin (HCC) Good blood sugar control is important to decrease the likelihood of diabetic complications such as nephropathy, neuropathy, limb loss, blindness, coronary artery disease, and death. Intensive lifestyle modification including diet, exercise and weight loss are the first line of treatment for diabetes. Pt denies need for refill of Trulicity.  Refill- metFORMIN (GLUCOPHAGE) 500 MG tablet; Take 1 tablet (500 mg total) by mouth daily with lunch.  Dispense: 30 tablet; Refill: 0  2. Vitamin D deficiency Low Vitamin D level contributes to fatigue and are associated with obesity, breast, and colon cancer. He agrees to restart Vitamin D 50,000 IU twice a week and will follow-up for routine testing of Vitamin D, at least 2-3 times per year to avoid over-replacement. Pt reports he has meds at home. He was provided with a pill box for the week to help with medication compliance. I recommend pt put an alarm in his phone.  Restart- Vitamin D, Ergocalciferol, (DRISDOL) 1.25 MG (50000 UNIT) CAPS capsule; Take 1 capsule (50,000 Units total) by mouth 2 (two) times a week.  Dispense: 10 capsule; Refill: 0  3. Vitamin B 12 deficiency The diagnosis was reviewed with the patient. Counseling provided today, see below. We will continue to monitor. Orders and follow up as documented in patient record.  Counseling The body needs vitamin B12: to make red blood cells; to make DNA; and to help the nerves work properly so they can carry messages from the brain to the body.  The main causes of vitamin B12 deficiency include dietary  deficiency, digestive diseases, pernicious anemia, and having a surgery in which part of the stomach or small intestine is removed.  Certain medicines can make it harder for the body to absorb vitamin B12. These medicines include: heartburn medications; some antibiotics; some medications used to treat diabetes, gout, and high cholesterol.  In some cases, there are no symptoms of this condition. If the condition leads to anemia or nerve damage, various symptoms can occur, such as weakness or fatigue, shortness of breath, and numbness or tingling in your hands and feet.   Treatment:  May include taking vitamin B12 supplements.  Avoid alcohol.  Eat lots of healthy foods that contain vitamin B12: Beef, pork, chicken, [place], and organ meats, such as liver.  Seafood: This includes clams, rainbow trout, salmon, tuna, and haddock. Eggs.  Cereal and dairy products that are fortified: This means that vitamin B12 has been added to the food.   Start- cyanocobalamin (CYANOCOBALAMIN) 500 MCG tablet; 300-500 mcg po qd OTC  4. Obesity, Current BMI 62.9 James Gross is currently in the action stage of change. As such, his goal is to continue with weight loss efforts. He has agreed to keeping a food journal and adhering to recommended goals of 1700-1800 calories and 180+ grams protein.   Track everything in log and bring it to next OV. Pt will meal prep every Sunday, like he did in the past and was successful.  Exercise goals:  As is  Behavioral modification strategies: meal planning and cooking strategies, planning for success, and keeping a strict food journal.  James Gross has agreed to follow-up with our clinic in 2 weeks. He was informed of the importance of frequent follow-up visits to maximize his success with intensive lifestyle modifications for his multiple health conditions.   Objective:   Blood pressure 117/86, pulse 88, temperature 98.2 F (36.8 C), height 5\' 8"  (1.727 m), weight (!) 413 lb (187.3 kg), SpO2  97 %. Body mass index is 62.8 kg/m.  General: Cooperative, alert, well developed, in no acute distress. HEENT: Conjunctivae and lids unremarkable. Cardiovascular: Regular rhythm.  Lungs: Normal work of breathing. Neurologic: No focal deficits.   Lab Results  Component Value Date   CREATININE 0.83 06/18/2021   BUN 15 06/18/2021   NA 139 06/18/2021   K 4.5 06/18/2021   CL 100 06/18/2021   CO2 25 06/18/2021   Lab Results  Component Value Date   ALT 40 06/18/2021   AST 20 06/18/2021   ALKPHOS 92 06/18/2021   BILITOT 0.3 06/18/2021   Lab Results  Component Value Date   HGBA1C 5.6 12/11/2021   HGBA1C 5.8 (H) 06/18/2021   HGBA1C 5.6 02/18/2021   HGBA1C 5.9 (H) 11/01/2020   HGBA1C 6.0 (H) 06/11/2020   Lab Results  Component Value Date   INSULIN 35.2 (H) 06/18/2021   INSULIN 26.3 (H) 02/18/2021   INSULIN 34.4 (H) 11/01/2020  INSULIN 30.8 (H) 06/11/2020   INSULIN 80.6 (H) 02/28/2020   Lab Results  Component Value Date   TSH 4.490 06/18/2021   Lab Results  Component Value Date   CHOL 179 02/18/2021   HDL 47 02/18/2021   LDLCALC 118 (H) 02/18/2021   TRIG 75 02/18/2021   CHOLHDL 4.3 11/01/2020   Lab Results  Component Value Date   VD25OH 16.5 (L) 12/11/2021   VD25OH 21.0 (L) 06/18/2021   VD25OH 24.2 (L) 02/18/2021   Lab Results  Component Value Date   WBC 4.6 06/18/2021   HGB 14.0 06/18/2021   HCT 43.0 06/18/2021   MCV 80 06/18/2021   PLT 285 06/18/2021    Attestation Statements:   Reviewed by clinician on day of visit: allergies, medications, problem list, medical history, surgical history, family history, social history, and previous encounter notes.  I, Kyung Rudd, BS, CMA, am acting as transcriptionist for [employer], DO.   I have reviewed the above documentation for accuracy and completeness, and I agree with the above. Carlye Grippe, D.O.  The 21st Century Cures Act was signed into law in 2016 which includes the topic of electronic  health records.  This provides immediate access to information in MyChart.  This includes consultation notes, operative notes, office notes, lab results and pathology reports.  If you have any questions about what you read please let us know at your next visit so we can discuss your concerns and take corrective action if need be.  We are right here with you.

## 2022-02-06 ENCOUNTER — Ambulatory Visit (INDEPENDENT_AMBULATORY_CARE_PROVIDER_SITE_OTHER): Payer: Medicaid Other | Admitting: Family Medicine

## 2022-02-12 ENCOUNTER — Encounter (INDEPENDENT_AMBULATORY_CARE_PROVIDER_SITE_OTHER): Payer: Self-pay

## 2022-02-24 ENCOUNTER — Ambulatory Visit (INDEPENDENT_AMBULATORY_CARE_PROVIDER_SITE_OTHER): Payer: Medicaid Other | Admitting: Family Medicine

## 2022-02-24 ENCOUNTER — Encounter (INDEPENDENT_AMBULATORY_CARE_PROVIDER_SITE_OTHER): Payer: Self-pay | Admitting: Family Medicine

## 2022-02-24 VITALS — BP 119/82 | HR 89 | Temp 98.9°F | Ht 68.0 in | Wt >= 6400 oz

## 2022-02-24 DIAGNOSIS — E559 Vitamin D deficiency, unspecified: Secondary | ICD-10-CM

## 2022-02-24 DIAGNOSIS — E1169 Type 2 diabetes mellitus with other specified complication: Secondary | ICD-10-CM | POA: Diagnosis not present

## 2022-02-24 DIAGNOSIS — E669 Obesity, unspecified: Secondary | ICD-10-CM

## 2022-02-24 DIAGNOSIS — Z7984 Long term (current) use of oral hypoglycemic drugs: Secondary | ICD-10-CM

## 2022-02-24 DIAGNOSIS — Z6841 Body Mass Index (BMI) 40.0 and over, adult: Secondary | ICD-10-CM

## 2022-02-24 DIAGNOSIS — E538 Deficiency of other specified B group vitamins: Secondary | ICD-10-CM

## 2022-02-24 DIAGNOSIS — Z7985 Long-term (current) use of injectable non-insulin antidiabetic drugs: Secondary | ICD-10-CM

## 2022-03-02 NOTE — Progress Notes (Unsigned)
Chief Complaint:   OBESITY James Gross is here to discuss his progress with his obesity treatment plan along with follow-up of his obesity related diagnoses. James Gross is on keeping a food journal and adhering to recommended goals of 1700-1800 calories and 180+ grams protein and states he is following his eating plan approximately 0% of the time. James Gross states he is not currently exercising.  Today's visit was #: 38 Starting weight: 471 lbs Starting date: 02/28/2020 Today's weight: 425 lbs Today's date: 02/24/2022 Total lbs lost to date: 46 Total lbs lost since last in-office visit: +12  Interim History: James Gross reports he feels that this is his worst couple of weeks ever. He hasn't been meal prepping (his mom usually does), but James Gross has been eating out a lot. He also had a family reunion and birthday party. He feels it's been a lot over the last month since his last OV.  Subjective:   1. Type 2 diabetes mellitus with other specified complication, without long-term current use of insulin (HCC) Lennell reports increased carb cravings. He has not been taking Metformin or Trulicity.  2. Vitamin D deficiency He has not been taking any of his meds.  3. Vitamin B 12 deficiency Pt is also not taking B12 supplement and reports fatigue.   Assessment/Plan:   1. Type 2 diabetes mellitus with other specified complication, without long-term current use of insulin (HCC) Good blood sugar control is important to decrease the likelihood of diabetic complications such as nephropathy, neuropathy, limb loss, blindness, coronary artery disease, and death. Intensive lifestyle modification including diet, exercise and weight loss are the first line of treatment for diabetes. Restart Metformin and Trulicity.  Lab/Orders today or future: - Comprehensive metabolic panel - Hemoglobin A1c; Future - Insulin, random - Lipid Panel With LDL/HDL Ratio  2. Vitamin D deficiency Low Vitamin D level contributes to fatigue and  are associated with obesity, breast, and colon cancer. He agrees to restart prescription Vitamin D @50 ,000 IU twice a week and will follow-up for routine testing of Vitamin D, at least 2-3 times per year to avoid over-replacement.  Lab/Orders today or future: - VITAMIN D 25 Hydroxy (Vit-D Deficiency, Fractures)  3. Vitamin B 12 deficiency The diagnosis was reviewed with the patient. Counseling provided today, see below. We will continue to monitor. Orders and follow up as documented in patient record. Restart OTC B12 200 mcg daily.  Counseling The body needs vitamin B12: to make red blood cells; to make DNA; and to help the nerves work properly so they can carry messages from the brain to the body.  The main causes of vitamin B12 deficiency include dietary deficiency, digestive diseases, pernicious anemia, and having a surgery in which part of the stomach or small intestine is removed.  Certain medicines can make it harder for the body to absorb vitamin B12. These medicines include: heartburn medications; some antibiotics; some medications used to treat diabetes, gout, and high cholesterol.  In some cases, there are no symptoms of this condition. If the condition leads to anemia or nerve damage, various symptoms can occur, such as weakness or fatigue, shortness of breath, and numbness or tingling in your hands and feet.   Treatment:  May include taking vitamin B12 supplements.  Avoid alcohol.  Eat lots of healthy foods that contain vitamin B12: Beef, pork, chicken, , and organ meats, such as liver.  Seafood: This includes clams, rainbow trout, salmon, tuna, and haddock. Eggs.  Cereal and dairy products that are fortified:  This means that vitamin B12 has been added to the food.   Lab/Orders today or future: - Vitamin B12  4. Obesity, Current BMI 64.6 James Gross is currently in the action stage of change. As such, his goal is to continue with weight loss efforts. He has agreed to keeping a  food journal and adhering to recommended goals of 1700-1800 calories and 180+ grams protein.   Restart meds.  Exercise goals:  As is  Behavioral modification strategies: increasing lean protein intake, decreasing simple carbohydrates, and avoiding temptations.  James Gross has agreed to follow-up with our clinic in 7-10 days for fasting blood work and repeat IC. He was informed of the importance of frequent follow-up visits to maximize his success with intensive lifestyle modifications for his multiple health conditions.   James Gross was informed we would discuss his lab results at his next visit unless there is a critical issue that needs to be addressed sooner. James Gross agreed to keep his next visit at the agreed upon time to discuss these results.  Objective:   Blood pressure 119/82, pulse 89, temperature 98.9 F (37.2 C), height 5\' 8"  (1.727 m), weight (!) 425 lb (192.8 kg), SpO2 96 %. Body mass index is 64.62 kg/m.  General: Cooperative, alert, well developed, in no acute distress. HEENT: Conjunctivae and lids unremarkable. Cardiovascular: Regular rhythm.  Lungs: Normal work of breathing. Neurologic: No focal deficits.   Lab Results  Component Value Date   CREATININE 0.83 06/18/2021   BUN 15 06/18/2021   NA 139 06/18/2021   K 4.5 06/18/2021   CL 100 06/18/2021   CO2 25 06/18/2021   Lab Results  Component Value Date   ALT 40 06/18/2021   AST 20 06/18/2021   ALKPHOS 92 06/18/2021   BILITOT 0.3 06/18/2021   Lab Results  Component Value Date   HGBA1C 5.6 12/11/2021   HGBA1C 5.8 (H) 06/18/2021   HGBA1C 5.6 02/18/2021   HGBA1C 5.9 (H) 11/01/2020   HGBA1C 6.0 (H) 06/11/2020   Lab Results  Component Value Date   INSULIN 35.2 (H) 06/18/2021   INSULIN 26.3 (H) 02/18/2021   INSULIN 34.4 (H) 11/01/2020   INSULIN 30.8 (H) 06/11/2020   INSULIN 80.6 (H) 02/28/2020   Lab Results  Component Value Date   TSH 4.490 06/18/2021   Lab Results  Component Value Date   CHOL 179 02/18/2021    HDL 47 02/18/2021   LDLCALC 118 (H) 02/18/2021   TRIG 75 02/18/2021   CHOLHDL 4.3 11/01/2020   Lab Results  Component Value Date   VD25OH 16.5 (L) 12/11/2021   VD25OH 21.0 (L) 06/18/2021   VD25OH 24.2 (L) 02/18/2021   Lab Results  Component Value Date   WBC 4.6 06/18/2021   HGB 14.0 06/18/2021   HCT 43.0 06/18/2021   MCV 80 06/18/2021   PLT 285 06/18/2021    Attestation Statements:   Reviewed by clinician on day of visit: allergies, medications, problem list, medical history, surgical history, family history, social history, and previous encounter notes.  I, 06/20/2021, BS, CMA, am acting as transcriptionist for Kyung Rudd, DO.  I have reviewed the above documentation for accuracy and completeness, and I agree with the above. [employer], D.O.  The 21st Century Cures Act was signed into law in 2016 which includes the topic of electronic health records.  This provides immediate access to information in MyChart.  This includes consultation notes, operative notes, office notes, lab results and pathology reports.  If you have any questions about  what you read please let us know at your next visit so we can discuss your concerns and take corrective action if need be.  We are right here with you.

## 2022-03-05 ENCOUNTER — Encounter (INDEPENDENT_AMBULATORY_CARE_PROVIDER_SITE_OTHER): Payer: Self-pay | Admitting: Family Medicine

## 2022-03-05 ENCOUNTER — Ambulatory Visit (INDEPENDENT_AMBULATORY_CARE_PROVIDER_SITE_OTHER): Payer: Medicaid Other | Admitting: Family Medicine

## 2022-03-05 VITALS — BP 120/80 | HR 77 | Temp 98.9°F | Ht 68.0 in | Wt >= 6400 oz

## 2022-03-05 DIAGNOSIS — E559 Vitamin D deficiency, unspecified: Secondary | ICD-10-CM

## 2022-03-05 DIAGNOSIS — Z7985 Long-term (current) use of injectable non-insulin antidiabetic drugs: Secondary | ICD-10-CM

## 2022-03-05 DIAGNOSIS — R0602 Shortness of breath: Secondary | ICD-10-CM | POA: Diagnosis not present

## 2022-03-05 DIAGNOSIS — Z7984 Long term (current) use of oral hypoglycemic drugs: Secondary | ICD-10-CM

## 2022-03-05 DIAGNOSIS — E538 Deficiency of other specified B group vitamins: Secondary | ICD-10-CM | POA: Diagnosis not present

## 2022-03-05 DIAGNOSIS — E669 Obesity, unspecified: Secondary | ICD-10-CM

## 2022-03-05 DIAGNOSIS — E1169 Type 2 diabetes mellitus with other specified complication: Secondary | ICD-10-CM | POA: Diagnosis not present

## 2022-03-05 DIAGNOSIS — Z6841 Body Mass Index (BMI) 40.0 and over, adult: Secondary | ICD-10-CM

## 2022-03-05 DIAGNOSIS — E1165 Type 2 diabetes mellitus with hyperglycemia: Secondary | ICD-10-CM

## 2022-03-05 MED ORDER — VITAMIN D (ERGOCALCIFEROL) 1.25 MG (50000 UNIT) PO CAPS: 50000.0000 [IU] | ORAL_CAPSULE | ORAL | 0 refills | Status: DC

## 2022-03-05 MED ORDER — TRULICITY 1.5 MG/0.5ML ~~LOC~~ SOAJ: 1.5000 mg | SUBCUTANEOUS | 0 refills | Status: DC

## 2022-03-05 MED ORDER — METFORMIN HCL 500 MG PO TABS: 500.0000 mg | ORAL_TABLET | Freq: Every day | ORAL | 0 refills | Status: DC

## 2022-03-05 MED ORDER — CYANOCOBALAMIN 500 MCG PO TABS: ORAL_TABLET | ORAL | Status: DC

## 2022-03-06 LAB — COMPREHENSIVE METABOLIC PANEL
ALT: 36 IU/L (ref 0–44)
AST: 22 IU/L (ref 0–40)
Albumin/Globulin Ratio: 1.2 (ref 1.2–2.2)
Albumin: 4.2 g/dL — ABNORMAL LOW (ref 4.3–5.2)
Alkaline Phosphatase: 88 IU/L (ref 44–121)
BUN/Creatinine Ratio: 18 (ref 9–20)
BUN: 14 mg/dL (ref 6–20)
Bilirubin Total: 0.2 mg/dL (ref 0.0–1.2)
CO2: 23 mmol/L (ref 20–29)
Calcium: 9.4 mg/dL (ref 8.7–10.2)
Chloride: 102 mmol/L (ref 96–106)
Creatinine, Ser: 0.79 mg/dL (ref 0.76–1.27)
Globulin, Total: 3.4 g/dL (ref 1.5–4.5)
Glucose: 86 mg/dL (ref 70–99)
Potassium: 4.4 mmol/L (ref 3.5–5.2)
Sodium: 140 mmol/L (ref 134–144)
Total Protein: 7.6 g/dL (ref 6.0–8.5)
eGFR: 130 mL/min/{1.73_m2} (ref >59)

## 2022-03-06 LAB — LIPID PANEL WITH LDL/HDL RATIO
Cholesterol, Total: 205 mg/dL — ABNORMAL HIGH (ref 100–199)
HDL: 53 mg/dL (ref >39)
LDL Chol Calc (NIH): 138 mg/dL — ABNORMAL HIGH (ref 0–99)
LDL/HDL Ratio: 2.6 ratio (ref 0.0–3.6)
Triglycerides: 78 mg/dL (ref 0–149)
VLDL Cholesterol Cal: 14 mg/dL (ref 5–40)

## 2022-03-06 LAB — VITAMIN D 25 HYDROXY (VIT D DEFICIENCY, FRACTURES): Vit D, 25-Hydroxy: 17.5 ng/mL — ABNORMAL LOW (ref 30.0–100.0)

## 2022-03-06 LAB — VITAMIN B12: Vitamin B-12: 354 pg/mL (ref 232–1245)

## 2022-03-06 LAB — INSULIN, RANDOM: INSULIN: 39.9 u[IU]/mL — ABNORMAL HIGH (ref 2.6–24.9)

## 2022-03-14 NOTE — Progress Notes (Unsigned)
Chief Complaint:   OBESITY James Gross is here to discuss his progress with his obesity treatment plan along with follow-up of his obesity related diagnoses. James Gross is on keeping a food journal and adhering to recommended goals of 1700-1800 calories and 180+ grams protein and states he is following his eating plan approximately 50% of the time. James Gross states he is not currently exercising.  Today's visit was #: 39 Starting weight: 471 lbs Starting date: 02/28/2020 Today's weight: 425 lbs Today's date: 03/05/2022 Total lbs lost to date: 46 Total lbs lost since last in-office visit: 0  Interim History: James Gross is getting better with meal prep and tracking the latter part of last week. He likes to journal and see what he's doing daily so he can make changes at the moment.  Subjective:   1. Type 2 diabetes mellitus with other specified complication, without long-term current use of insulin (HCC) [name] is tolerating Trulicity well without side effects or concerns. He is able to eat all of his foods and denies GI side effects. Pt is non-compliant with Metformin.  2. Vitamin D deficiency Pt with poor medication compliance. Counseling done on the importance of taking medication for his well-being.  3. SOB (shortness of breath) on exertion Prior IC on 02/18/2021 at 398 lbs was 2318 REE and is now at 425 lbs, repeat IC= 2808 REE.   4. Vitamin B 12 deficiency James Gross reports fatigue, but it is improving some with eating a little better.  Assessment/Plan:  No orders of the defined types were placed in this encounter.   Medications Discontinued During This Encounter  Medication Reason   Vitamin D, Ergocalciferol, (DRISDOL) 1.25 MG (50000 UNIT) CAPS capsule Reorder   metFORMIN (GLUCOPHAGE) 500 MG tablet Reorder   cyanocobalamin (CYANOCOBALAMIN) 500 MCG tablet Reorder   Dulaglutide (TRULICITY) 1.5 MG/0.5ML SOPN Reorder     Meds ordered this encounter  Medications   metFORMIN (GLUCOPHAGE) 500 MG tablet     Sig: Take 1 tablet (500 mg total) by mouth daily with lunch.    Dispense:  30 tablet    Refill:  0   Vitamin D, Ergocalciferol, (DRISDOL) 1.25 MG (50000 UNIT) CAPS capsule    Sig: Take 1 capsule (50,000 Units total) by mouth 2 (two) times a week.    Dispense:  10 capsule    Refill:  0    30 d supply;  ** OV for RF **   Do not send RF request   cyanocobalamin (VITAMIN B12) 500 MCG tablet    Sig: 300-500 mcg po qd OTC   Dulaglutide (TRULICITY) 1.5 MG/0.5ML SOPN    Sig: Inject 1.5 mg into the skin once a week.    Dispense:  2 mL    Refill:  0     1. Type 2 diabetes mellitus with other specified complication, without long-term current use of insulin (HCC) Good blood sugar control is important to decrease the likelihood of diabetic complications such as nephropathy, neuropathy, limb loss, blindness, coronary artery disease, and death. Intensive lifestyle modification including diet, exercise and weight loss are the first line of treatment for diabetes.  James Gross hasn't restarted Metformin yet, but did restart Trulicity and is tolerating it well without side effects now. Pt did have a little GI upset after the first shot for 24 hours.  Refill- metFORMIN (GLUCOPHAGE) 500 MG tablet; Take 1 tablet (500 mg total) by mouth daily with lunch.  Dispense: 30 tablet; Refill: 0 Refill (since last given 30 day supply on  09/05/2021)- Dulaglutide (TRULICITY) 1.5 MG/0.5ML SOPN; Inject 1.5 mg into the skin once a week.  Dispense: 2 mL; Refill: 0  2. Vitamin D deficiency Pt hasn't restarted Ergocalciferol yet. He never did pick it up from the pharmacy. Low Vitamin D level contributes to fatigue and are associated with obesity, breast, and colon cancer. He agrees to restart prescription Vitamin D 50,000 IU twice a week and will follow-up for routine testing of Vitamin D, at least 2-3 times per year to avoid over-replacement.  Refill, upon pt request since it's been so long.- Vitamin D, Ergocalciferol, (DRISDOL) 1.25  MG (50000 UNIT) CAPS capsule; Take 1 capsule (50,000 Units total) by mouth 2 (two) times a week.  Dispense: 10 capsule; Refill: 0  3. SOB (shortness of breath) on exertion Increased REE, therefore, increase calories per day- change meal plan to 2000-2200 calories with 180+ grams of protein per day.   4. Vitamin B 12 deficiency James Gross hasn't restarted OTC B12 yet and did not get it from the pharmacy yet. Reminded pt he needs OTC B12 300-500 mcg daily. The diagnosis was reviewed with the patient. Counseling provided today, see below. We will continue to monitor. Orders and follow up as documented in patient record.  Counseling The body needs vitamin B12: to make red blood cells; to make DNA; and to help the nerves work properly so they can carry messages from the brain to the body.  The main causes of vitamin B12 deficiency include dietary deficiency, digestive diseases, pernicious anemia, and having a surgery in which part of the stomach or small intestine is removed.  Certain medicines can make it harder for the body to absorb vitamin B12. These medicines include: heartburn medications; some antibiotics; some medications used to treat diabetes, gout, and high cholesterol.  In some cases, there are no symptoms of this condition. If the condition leads to anemia or nerve damage, various symptoms can occur, such as weakness or fatigue, shortness of breath, and numbness or tingling in your hands and feet.   Treatment:  May include taking vitamin B12 supplements.  Avoid alcohol.  Eat lots of healthy foods that contain vitamin B12: Beef, pork, chicken, [place], and organ meats, such as liver.  Seafood: This includes clams, rainbow trout, salmon, tuna, and haddock. Eggs.  Cereal and dairy products that are fortified: This means that vitamin B12 has been added to the food.   Restart- cyanocobalamin (VITAMIN B12) 500 MCG tablet; 300-500 mcg po qd OTC  5. Obesity, Current BMI 64.7 [name] is currently in the  action stage of change. As such, his goal is to continue with weight loss efforts. He has agreed to change to keeping a food journal and adhering to recommended goals of 2000-2200 calories and 180+ grams protein.   Ian will get labs today to get baseline results again since he's been off all meds for a while and regained some weight. Also, repeat IC done.  Exercise goals:  As is  Behavioral modification strategies: take all meds regularly as prescribed; planning for success and keeping a strict food journal.  Kenyetta has agreed to follow-up with our clinic in 3 weeks. He was informed of the importance of frequent follow-up visits to maximize his success with intensive lifestyle modifications for his multiple health conditions.   Objective:   Blood pressure 120/80, pulse 77, temperature 98.9 F (37.2 C), height 5\' 8"  (1.727 m), weight (!) 425 lb (192.8 kg), SpO2 97 %. Body mass index is 64.62 kg/m.  General: Cooperative,  alert, well developed, in no acute distress. HEENT: Conjunctivae and lids unremarkable. Cardiovascular: Regular rhythm.  Lungs: Normal work of breathing. Neurologic: No focal deficits.   Lab Results  Component Value Date   CREATININE 0.79 03/05/2022   BUN 14 03/05/2022   NA 140 03/05/2022   K 4.4 03/05/2022   CL 102 03/05/2022   CO2 23 03/05/2022   Lab Results  Component Value Date   ALT 36 03/05/2022   AST 22 03/05/2022   ALKPHOS 88 03/05/2022   BILITOT 0.2 03/05/2022   Lab Results  Component Value Date   HGBA1C 5.6 12/11/2021   HGBA1C 5.8 (H) 06/18/2021   HGBA1C 5.6 02/18/2021   HGBA1C 5.9 (H) 11/01/2020   HGBA1C 6.0 (H) 06/11/2020   Lab Results  Component Value Date   INSULIN 39.9 (H) 03/05/2022   INSULIN 35.2 (H) 06/18/2021   INSULIN 26.3 (H) 02/18/2021   INSULIN 34.4 (H) 11/01/2020   INSULIN 30.8 (H) 06/11/2020   Lab Results  Component Value Date   TSH 4.490 06/18/2021   Lab Results  Component Value Date   CHOL 205 (H) 03/05/2022   HDL  53 03/05/2022   LDLCALC 138 (H) 03/05/2022   TRIG 78 03/05/2022   CHOLHDL 4.3 11/01/2020   Lab Results  Component Value Date   VD25OH 17.5 (L) 03/05/2022   VD25OH 16.5 (L) 12/11/2021   VD25OH 21.0 (L) 06/18/2021   Lab Results  Component Value Date   WBC 4.6 06/18/2021   HGB 14.0 06/18/2021   HCT 43.0 06/18/2021   MCV 80 06/18/2021   PLT 285 06/18/2021   Attestation Statements:   Reviewed by clinician on day of visit: allergies, medications, problem list, medical history, surgical history, family history, social history, and previous encounter notes.  Time spent on visit including pre-visit chart review and post-visit care and charting was 40 minutes.   I, Kyung Rudd, BS, CMA, am acting as transcriptionist for [employer], DO.  I have reviewed the above documentation for accuracy and completeness, and I agree with the above. Carlye Grippe, D.O.  The 21st Century Cures Act was signed into law in 2016 which includes the topic of electronic health records.  This provides immediate access to information in MyChart.  This includes consultation notes, operative notes, office notes, lab results and pathology reports.  If you have any questions about what you read please let us know at your next visit so we can discuss your concerns and take corrective action if need be.  We are right here with you.

## 2022-03-26 ENCOUNTER — Ambulatory Visit (INDEPENDENT_AMBULATORY_CARE_PROVIDER_SITE_OTHER): Payer: Medicaid Other | Admitting: Family Medicine

## 2022-04-09 ENCOUNTER — Encounter (INDEPENDENT_AMBULATORY_CARE_PROVIDER_SITE_OTHER): Payer: Self-pay | Admitting: Family Medicine

## 2022-04-09 ENCOUNTER — Ambulatory Visit (INDEPENDENT_AMBULATORY_CARE_PROVIDER_SITE_OTHER): Payer: Medicaid Other | Admitting: Family Medicine

## 2022-04-09 VITALS — BP 113/79 | HR 94 | Temp 98.6°F | Ht 68.0 in | Wt >= 6400 oz

## 2022-04-09 DIAGNOSIS — E1169 Type 2 diabetes mellitus with other specified complication: Secondary | ICD-10-CM | POA: Diagnosis not present

## 2022-04-09 DIAGNOSIS — Z6841 Body Mass Index (BMI) 40.0 and over, adult: Secondary | ICD-10-CM | POA: Diagnosis not present

## 2022-04-09 DIAGNOSIS — E559 Vitamin D deficiency, unspecified: Secondary | ICD-10-CM | POA: Diagnosis not present

## 2022-04-09 DIAGNOSIS — E669 Obesity, unspecified: Secondary | ICD-10-CM

## 2022-04-09 DIAGNOSIS — Z91148 Patient's other noncompliance with medication regimen for other reason: Secondary | ICD-10-CM

## 2022-04-09 MED ORDER — VITAMIN D (ERGOCALCIFEROL) 1.25 MG (50000 UNIT) PO CAPS: 50000.0000 [IU] | ORAL_CAPSULE | ORAL | 0 refills | Status: DC

## 2022-04-09 MED ORDER — METFORMIN HCL 500 MG PO TABS: 500.0000 mg | ORAL_TABLET | Freq: Every day | ORAL | 0 refills | Status: DC

## 2022-04-09 MED ORDER — TRULICITY 1.5 MG/0.5ML ~~LOC~~ SOAJ: 1.5000 mg | SUBCUTANEOUS | 0 refills | Status: DC

## 2022-04-21 DIAGNOSIS — Z91148 Patient's other noncompliance with medication regimen for other reason: Secondary | ICD-10-CM | POA: Insufficient documentation

## 2022-05-08 ENCOUNTER — Ambulatory Visit (INDEPENDENT_AMBULATORY_CARE_PROVIDER_SITE_OTHER): Payer: Medicaid Other | Admitting: Family Medicine

## 2022-05-10 NOTE — Progress Notes (Unsigned)
Chief Complaint:   OBESITY James Gross is here to discuss his progress with his obesity treatment plan along with follow-up of his obesity related diagnoses. James Gross is on keeping a food journal and adhering to recommended goals of 2000-2200 calories and 180+ grams protein and states he is following his eating plan approximately 0% of the time. James Gross states he is not currently exercising.  Today's visit was #: 40 Starting weight: 471 lbs Starting date: 02/28/2020 Today's weight: 441 lbs Today's date: 04/09/2022 Total lbs lost to date: 30 Total lbs lost since last in-office visit: +16  Interim History: Pt has been struggling with getting back on plan. He lives with his mom, and pt does the grocery shopping and cooking.  Subjective:   1. Vitamin D deficiency Ojas is not taking a Vit D supplement regularly.  2. Diabetes mellitus type 2 in obese [facility]) He is not taking any meds currently. Pt's last A1c was 5.6 on 12/11/2021.  3. Non compliance with medication regimen Pt doesn't know why he forgets to take his meds. He says it's just not on his mind. Gl[place]on denies side effects or financial issues with obtaining meds.  Assessment/Plan:  No orders of the defined types were placed in this encounter.   Medications Discontinued During This Encounter  Medication Reason   metFORMIN (GLUCOPHAGE) 500 MG tablet Reorder   Vitamin D, Ergocalciferol, (DRISDOL) 1.25 MG (50000 UNIT) CAPS capsule Reorder   Dulaglutide (TRULICITY) 1.5 MG/0.5ML SOPN Reorder     Meds ordered this encounter  Medications   Dulaglutide (TRULICITY) 1.5 MG/0.5ML SOPN    Sig: Inject 1.5 mg into the skin once a week.    Dispense:  2 mL    Refill:  0   metFORMIN (GLUCOPHAGE) 500 MG tablet    Sig: Take 1 tablet (500 mg total) by mouth daily with lunch.    Dispense:  30 tablet    Refill:  0   Vitamin D, Ergocalciferol, (DRISDOL) 1.25 MG (50000 UNIT) CAPS capsule    Sig: Take 1 capsule (50,000 Units total) by mouth 2 (two) times  a week.    Dispense:  10 capsule    Refill:  0    30 d supply;  ** OV for RF **   Do not send RF request     1. Vitamin D deficiency Low Vitamin D level contributes to fatigue and are associated with obesity, breast, and colon cancer. He agrees to continue to take prescription Vitamin D @50 ,000 IU every week and will follow-up for routine testing of Vitamin D, at least 2-3 times per year to avoid over-replacement.  Refill- Vitamin D, Ergocalciferol, (DRISDOL) 1.25 MG (50000 UNIT) CAPS capsule; Take 1 capsule (50,000 Units total) by mouth 2 (two) times a week.  Dispense: 10 capsule; Refill: 0  2. Diabetes mellitus type 2 in obese (HCC) Good blood sugar control is important to decrease the likelihood of diabetic complications such as nephropathy, neuropathy, limb loss, blindness, coronary artery disease, and death. Intensive lifestyle modification including diet, exercise and weight loss are the first line of treatment for diabetes.  Risks associated with side effects of restarting meds discussed with pt. Start Metformin- 1st at half dose and slowly wean up. Then start Trulicity, if tolerating well.  If pt is not seeing his PCP in the near future, we will need to check labs.  Start- Dulaglutide (TRULICITY) 1.5 MG/0.5ML SOPN; Inject 1.5 mg into the skin once a week.  Dispense: 2 mL; Refill: 0 Start-  metFORMIN (GLUCOPHAGE) 500 MG tablet; Take 1 tablet (500 mg total) by mouth daily with lunch.  Dispense: 30 tablet; Refill: 0  3. Non compliance with medication regimen Strategies to increase adherence to meds discussed with pt. Use pill box and develop a routine. Discussed with patient how all meds will help him in achieving his goals.  4. Obesity, Current BMI 67.1 James Gross is currently in the action stage of change. As such, his goal is to continue with weight loss efforts. He has agreed to change to the Category 4 Plan with lunch options and an extra 2-4 oz of lean protein with dinner.   Meal prep  and plan. Buy groceries and cook lean proteins. Limit eating out to 2 meals per week.  Exercise goals:  Start walking 5-10 minutes daily.  Behavioral modification strategies: decreasing simple carbohydrates, decreasing liquid calories, meal planning and cooking strategies, dealing with family or coworker sabotage, and planning for success.  Macky has agreed to follow-up with our clinic in 3 weeks. He was informed of the importance of frequent follow-up visits to maximize his success with intensive lifestyle modifications for his multiple health conditions.   Objective:   Blood pressure 113/79, pulse 94, temperature 98.6 F (37 C), height 5\' 8"  (1.727 m), weight (!) 441 lb (200 kg), SpO2 96 %. Body mass index is 67.05 kg/m.  General: Cooperative, alert, well developed, in no acute distress. HEENT: Conjunctivae and lids unremarkable. Cardiovascular: Regular rhythm.  Lungs: Normal work of breathing. Neurologic: No focal deficits.   Lab Results  Component Value Date   CREATININE 0.79 03/05/2022   BUN 14 03/05/2022   NA 140 03/05/2022   K 4.4 03/05/2022   CL 102 03/05/2022   CO2 23 03/05/2022   Lab Results  Component Value Date   ALT 36 03/05/2022   AST 22 03/05/2022   ALKPHOS 88 03/05/2022   BILITOT 0.2 03/05/2022   Lab Results  Component Value Date   HGBA1C 5.6 12/11/2021   HGBA1C 5.8 (H) 06/18/2021   HGBA1C 5.6 02/18/2021   HGBA1C 5.9 (H) 11/01/2020   HGBA1C 6.0 (H) 06/11/2020   Lab Results  Component Value Date   INSULIN 39.9 (H) 03/05/2022   INSULIN 35.2 (H) 06/18/2021   INSULIN 26.3 (H) 02/18/2021   INSULIN 34.4 (H) 11/01/2020   INSULIN 30.8 (H) 06/11/2020   Lab Results  Component Value Date   TSH 4.490 06/18/2021   Lab Results  Component Value Date   CHOL 205 (H) 03/05/2022   HDL 53 03/05/2022   LDLCALC 138 (H) 03/05/2022   TRIG 78 03/05/2022   CHOLHDL 4.3 11/01/2020   Lab Results  Component Value Date   VD25OH 17.5 (L) 03/05/2022   VD25OH 16.5  (L) 12/11/2021   VD25OH 21.0 (L) 06/18/2021   Lab Results  Component Value Date   WBC 4.6 06/18/2021   HGB 14.0 06/18/2021   HCT 43.0 06/18/2021   MCV 80 06/18/2021   PLT 285 06/18/2021    Attestation Statements:   Reviewed by clinician on day of visit: allergies, medications, problem list, medical history, surgical history, family history, social history, and previous encounter notes.  I, Kathlene November, BS, CMA, am acting as transcriptionist for [employer], DO.   I have reviewed the above documentation for accuracy and completeness, and I agree with the above. Marjory Sneddon, D.O.  The [place] was signed into law in 2016 which includes the topic of electronic health records.  This provides immediate access to  information in [place].  This includes consultation notes, operative notes, office notes, lab results and pathology reports.  If you have any questions about what you read please let us know at your next visit so we can discuss your concerns and take corrective action if need be.  We are right here with you.

## 2022-05-21 ENCOUNTER — Encounter (INDEPENDENT_AMBULATORY_CARE_PROVIDER_SITE_OTHER): Payer: Self-pay | Admitting: Family Medicine

## 2022-05-21 ENCOUNTER — Ambulatory Visit (INDEPENDENT_AMBULATORY_CARE_PROVIDER_SITE_OTHER): Payer: Medicaid Other | Admitting: Family Medicine

## 2022-05-21 VITALS — BP 104/68 | HR 96 | Temp 98.5°F | Ht 68.0 in | Wt >= 6400 oz

## 2022-05-21 DIAGNOSIS — E538 Deficiency of other specified B group vitamins: Secondary | ICD-10-CM

## 2022-05-21 DIAGNOSIS — E669 Obesity, unspecified: Secondary | ICD-10-CM | POA: Diagnosis not present

## 2022-05-21 DIAGNOSIS — Z7985 Long-term (current) use of injectable non-insulin antidiabetic drugs: Secondary | ICD-10-CM

## 2022-05-21 DIAGNOSIS — Z7984 Long term (current) use of oral hypoglycemic drugs: Secondary | ICD-10-CM

## 2022-05-21 DIAGNOSIS — E1169 Type 2 diabetes mellitus with other specified complication: Secondary | ICD-10-CM

## 2022-05-21 DIAGNOSIS — E559 Vitamin D deficiency, unspecified: Secondary | ICD-10-CM

## 2022-05-21 DIAGNOSIS — Z6841 Body Mass Index (BMI) 40.0 and over, adult: Secondary | ICD-10-CM

## 2022-05-21 MED ORDER — VITAMIN D (ERGOCALCIFEROL) 1.25 MG (50000 UNIT) PO CAPS: 50000.0000 [IU] | ORAL_CAPSULE | ORAL | 0 refills | Status: DC

## 2022-05-21 MED ORDER — METFORMIN HCL 500 MG PO TABS: 500.0000 mg | ORAL_TABLET | Freq: Every day | ORAL | 0 refills | Status: DC

## 2022-05-21 MED ORDER — TRULICITY 1.5 MG/0.5ML ~~LOC~~ SOAJ: 1.5000 mg | SUBCUTANEOUS | 0 refills | Status: DC

## 2022-05-22 ENCOUNTER — Ambulatory Visit (INDEPENDENT_AMBULATORY_CARE_PROVIDER_SITE_OTHER): Payer: Medicaid Other | Admitting: Family Medicine

## 2022-06-02 NOTE — Progress Notes (Signed)
Chief Complaint:   OBESITY James Gross is here to discuss his progress with his obesity treatment plan along with follow-up of his obesity related diagnoses. James Gross is on the Category 4 Plan with lunch options and extra 2-4 oz of protein with dinner and states he is following his eating plan approximately 0% of the time. James Gross states he is not exercising.  Today's visit was #: 41 Starting weight: 471 lbs Starting date: 02/28/2020 Today's weight: 451 lbs Today's date: 05/21/2022 Total lbs lost to date: 20 lbs Total lbs lost since last in-office visit: +10 lbs  Interim History: Still not taking medicine at all.  Has cravings and is still going to eat fast foods at times instead of eating prepared foods.    Subjective:   1. Type 2 diabetes mellitus with obesity (HCC) Not checking blood sugars.  He is asymptomatic.  He is not taking any medications.   2. Vitamin D deficiency He is not taking any medications.   3. Vitamin B 12 deficiency Not taking B12 or any other medications.    Assessment/Plan:  No orders of the defined types were placed in this encounter.   Medications Discontinued During This Encounter  Medication Reason   Dulaglutide (TRULICITY) 1.5 MG/0.5ML SOPN Reorder   metFORMIN (GLUCOPHAGE) 500 MG tablet Reorder   Vitamin D, Ergocalciferol, (DRISDOL) 1.25 MG (50000 UNIT) CAPS capsule Reorder     Meds ordered this encounter  Medications   DISCONTD: Vitamin D, Ergocalciferol, (DRISDOL) 1.25 MG (50000 UNIT) CAPS capsule    Sig: Take 1 capsule (50,000 Units total) by mouth 2 (two) times a week. Q Thurs and Mon    Dispense:  10 capsule    Refill:  0    30 d supply;  ** OV for RF **   Do not send RF request   DISCONTD: metFORMIN (GLUCOPHAGE) 500 MG tablet    Sig: Take 1 tablet (500 mg total) by mouth daily with lunch.    Dispense:  30 tablet    Refill:  0   DISCONTD: Dulaglutide (TRULICITY) 1.5 MG/0.5ML SOPN    Sig: Inject 1.5 mg into the skin once a week. Every  thursday    Dispense:  2 mL    Refill:  0     1. Type 2 diabetes mellitus with obesity (HCC) Refill- metFORMIN (GLUCOPHAGE) 500 MG tablet; Take 1 tablet (500 mg total) by mouth daily with lunch.  Dispense: 30 tablet; Refill: 0  Refill - Dulaglutide (TRULICITY) 1.5 MG/0.5ML SOPN; Inject 1.5 mg into the skin once a week. Every thursday  Dispense: 2 mL; Refill: 0  2. Vitamin D deficiency Refill - Vitamin D, Ergocalciferol, (DRISDOL) 1.25 MG (50000 UNIT) CAPS capsule; Take 1 capsule (50,000 Units total) by mouth 2 (two) times a week. Q Thurs and Mon  Dispense: 10 capsule; Refill: 0  3. Vitamin B 12 deficiency The diagnosis was reviewed with the patient. Counseling provided today, see below. We will continue to monitor. Orders and follow up as documented in patient record. Take B12 daily as written.    Counseling The body needs vitamin B12: to make red blood cells; to make DNA; and to help the nerves work properly so they can carry messages from the brain to the body.  The main causes of vitamin B12 deficiency include dietary deficiency, digestive diseases, pernicious anemia, and having a surgery in which part of the stomach or small intestine is removed.  Certain medicines can make it harder for the body to  absorb vitamin B12. These medicines include: heartburn medications; some antibiotics; some medications used to treat diabetes, gout, and high cholesterol.  In some cases, there are no symptoms of this condition. If the condition leads to anemia or nerve damage, various symptoms can occur, such as weakness or fatigue, shortness of breath, and numbness or tingling in your hands and feet.   Treatment:  May include taking vitamin B12 supplements.  Avoid alcohol.  Eat lots of healthy foods that contain vitamin B12: Beef, pork, chicken, [place], and organ meats, such as liver.  Seafood: This includes clams, rainbow trout, salmon, tuna, and haddock. Eggs.  Cereal and dairy products that are  fortified: This means that vitamin B12 has been added to the food.   4. Obesity, current BMI 68.6 Patient will call insurance regarding Ozempic or Mounjaro coverage.  Take all medications.   James Gross is currently in the action stage of change. As such, his goal is to continue with weight loss efforts. He has agreed to keeping a food journal and adhering to recommended goals of 2100-2200 calories and 160 protein daily.   Exercise goals:  As is.   Behavioral modification strategies: increasing lean protein intake, decreasing simple carbohydrates, meal planning and cooking strategies, and avoiding temptations.  James Gross has agreed to follow-up with our clinic in 2-3 weeks. He was informed of the importance of frequent follow-up visits to maximize his success with intensive lifestyle modifications for his multiple health conditions.   Objective:   Blood pressure 104/68, pulse 96, temperature 98.5 F (36.9 C), height 5\' 8"  (1.727 m), weight (!) 451 lb (204.6 kg), SpO2 96 %. Body mass index is 68.57 kg/m.  General: Cooperative, alert, well developed, in no acute distress. HEENT: Conjunctivae and lids unremarkable. Cardiovascular: Regular rhythm.  Lungs: Normal work of breathing. Neurologic: No focal deficits.   Lab Results  Component Value Date   CREATININE 0.79 03/05/2022   BUN 14 03/05/2022   NA 140 03/05/2022   K 4.4 03/05/2022   CL 102 03/05/2022   CO2 23 03/05/2022   Lab Results  Component Value Date   ALT 36 03/05/2022   AST 22 03/05/2022   ALKPHOS 88 03/05/2022   BILITOT 0.2 03/05/2022   Lab Results  Component Value Date   HGBA1C 5.6 12/11/2021   HGBA1C 5.8 (H) 06/18/2021   HGBA1C 5.6 02/18/2021   HGBA1C 5.9 (H) 11/01/2020   HGBA1C 6.0 (H) 06/11/2020   Lab Results  Component Value Date   INSULIN 39.9 (H) 03/05/2022   INSULIN 35.2 (H) 06/18/2021   INSULIN 26.3 (H) 02/18/2021   INSULIN 34.4 (H) 11/01/2020   INSULIN 30.8 (H) 06/11/2020   Lab Results  Component Value  Date   TSH 4.490 06/18/2021   Lab Results  Component Value Date   CHOL 205 (H) 03/05/2022   HDL 53 03/05/2022   LDLCALC 138 (H) 03/05/2022   TRIG 78 03/05/2022   CHOLHDL 4.3 11/01/2020   Lab Results  Component Value Date   VD25OH 17.5 (L) 03/05/2022   VD25OH 16.5 (L) 12/11/2021   VD25OH 21.0 (L) 06/18/2021   Lab Results  Component Value Date   WBC 4.6 06/18/2021   HGB 14.0 06/18/2021   HCT 43.0 06/18/2021   MCV 80 06/18/2021   PLT 285 06/18/2021   No results found for: "IRON", "TIBC", "FERRITIN"  Attestation Statements:   Reviewed by clinician on day of visit: allergies, medications, problem list, medical history, surgical history, family history, social history, and previous encounter notes.  I,  Davy Pique, RMA, am acting as Location manager for [employer], DO.  I have reviewed the above documentation for accuracy and completeness, and I agree with the above. Marjory Sneddon, D.O.  The [place] was signed into law in 2016 which includes the topic of electronic health records.  This provides immediate access to information in MyChart.  This includes consultation notes, operative notes, office notes, lab results and pathology reports.  If you have any questions about what you read please let us know at your next visit so we can discuss your concerns and take corrective action if need be.  We are right here with you.

## 2022-06-03 ENCOUNTER — Ambulatory Visit (INDEPENDENT_AMBULATORY_CARE_PROVIDER_SITE_OTHER): Payer: Medicaid Other | Admitting: Physician Assistant

## 2022-06-03 ENCOUNTER — Encounter (INDEPENDENT_AMBULATORY_CARE_PROVIDER_SITE_OTHER): Payer: Self-pay | Admitting: Physician Assistant

## 2022-06-03 VITALS — BP 134/72 | HR 90 | Temp 98.1°F | Ht 68.0 in | Wt >= 6400 oz

## 2022-06-03 DIAGNOSIS — E7849 Other hyperlipidemia: Secondary | ICD-10-CM | POA: Diagnosis not present

## 2022-06-03 DIAGNOSIS — E559 Vitamin D deficiency, unspecified: Secondary | ICD-10-CM

## 2022-06-03 DIAGNOSIS — E1169 Type 2 diabetes mellitus with other specified complication: Secondary | ICD-10-CM

## 2022-06-03 DIAGNOSIS — E538 Deficiency of other specified B group vitamins: Secondary | ICD-10-CM | POA: Diagnosis not present

## 2022-06-03 DIAGNOSIS — Z6841 Body Mass Index (BMI) 40.0 and over, adult: Secondary | ICD-10-CM

## 2022-06-03 DIAGNOSIS — E669 Obesity, unspecified: Secondary | ICD-10-CM

## 2022-06-03 MED ORDER — TRULICITY 1.5 MG/0.5ML ~~LOC~~ SOAJ: 1.5000 mg | SUBCUTANEOUS | 0 refills | Status: DC

## 2022-06-03 MED ORDER — VITAMIN D (ERGOCALCIFEROL) 1.25 MG (50000 UNIT) PO CAPS: 50000.0000 [IU] | ORAL_CAPSULE | ORAL | 0 refills | Status: DC

## 2022-06-03 MED ORDER — METFORMIN HCL 500 MG PO TABS: 500.0000 mg | ORAL_TABLET | Freq: Every day | ORAL | 0 refills | Status: DC

## 2022-06-04 ENCOUNTER — Ambulatory Visit (INDEPENDENT_AMBULATORY_CARE_PROVIDER_SITE_OTHER): Payer: Medicaid Other | Admitting: Physician Assistant

## 2022-06-04 LAB — CMP14+EGFR
ALT: 38 IU/L (ref 0–44)
AST: 22 IU/L (ref 0–40)
Albumin/Globulin Ratio: 1.2 (ref 1.2–2.2)
Albumin: 4 g/dL — ABNORMAL LOW (ref 4.3–5.2)
Alkaline Phosphatase: 85 IU/L (ref 44–121)
BUN/Creatinine Ratio: 19 (ref 9–20)
BUN: 15 mg/dL (ref 6–20)
Bilirubin Total: <0.2 mg/dL (ref 0.0–1.2)
CO2: 23 mmol/L (ref 20–29)
Calcium: 9.4 mg/dL (ref 8.7–10.2)
Chloride: 103 mmol/L (ref 96–106)
Creatinine, Ser: 0.81 mg/dL (ref 0.76–1.27)
Globulin, Total: 3.3 g/dL (ref 1.5–4.5)
Glucose: 103 mg/dL — ABNORMAL HIGH (ref 70–99)
Potassium: 4.5 mmol/L (ref 3.5–5.2)
Sodium: 140 mmol/L (ref 134–144)
Total Protein: 7.3 g/dL (ref 6.0–8.5)
eGFR: 129 mL/min/{1.73_m2} (ref >59)

## 2022-06-04 LAB — LIPID PANEL WITH LDL/HDL RATIO
Cholesterol, Total: 195 mg/dL (ref 100–199)
HDL: 53 mg/dL (ref >39)
LDL Chol Calc (NIH): 123 mg/dL — ABNORMAL HIGH (ref 0–99)
LDL/HDL Ratio: 2.3 ratio (ref 0.0–3.6)
Triglycerides: 107 mg/dL (ref 0–149)
VLDL Cholesterol Cal: 19 mg/dL (ref 5–40)

## 2022-06-04 LAB — VITAMIN D 25 HYDROXY (VIT D DEFICIENCY, FRACTURES): Vit D, 25-Hydroxy: 15.2 ng/mL — ABNORMAL LOW (ref 30.0–100.0)

## 2022-06-04 LAB — VITAMIN B12: Vitamin B-12: 352 pg/mL (ref 232–1245)

## 2022-06-04 LAB — HEMOGLOBIN A1C
Est. average glucose Bld gHb Est-mCnc: 126 mg/dL
Hgb A1c MFr Bld: 6 % — ABNORMAL HIGH (ref 4.8–5.6)

## 2022-06-04 LAB — INSULIN, RANDOM: INSULIN: 68.2 u[IU]/mL — ABNORMAL HIGH (ref 2.6–24.9)

## 2022-06-11 NOTE — Progress Notes (Signed)
Chief Complaint:   OBESITY James Gross is here to discuss his progress with his obesity treatment plan along with follow-up of his obesity related diagnoses. James Gross is on keeping a food journal and adhering to recommended goals of 2100-2200 calories and 160 grams of protein and states he is following his eating plan approximately 0% of the time. James Gross states he is exercising 0 minutes 0 times per week.  Today's visit was #: 16 Starting weight: 471 lbs Starting date: 02/28/2020 Today's weight: 451 lbs Today's date: 06/03/2022 Total lbs lost to date: 20 lbs Total lbs lost since last in-office visit: 0  Interim History: James Gross got off track with Thanksgiving--working to get back on track now. Getting 100-120+ grams of protein.  Subjective:   1. Type 2 diabetes mellitus with obesity (HCC) James Gross has not taken Metformin or Trulicity for the past several months. Wants to get back on medications now.   2. Vitamin D deficiency James Gross reports has not been taking Vit D and ready to resume taking now.  3. Vitamin B 12 deficiency James Gross's last level of 433.  4. Other hyperlipidemia James Gross's LDL at 138/HDL at 53. Not taking a statin medication.   Assessment/Plan:   1. Type 2 diabetes mellitus with obesity (HCC) We will obtain labs today. We will refill Trulicity 1.5 mg SQ once weekly AND refill Metformin 500 mg daily  for 1 month with 0 refills. We discussed the importance of taking his medications on a regular basis to achieve his weight loss and health goals. Encouraged him to reach out if any questions about his medications or other concerns.   -Refill Dulaglutide (TRULICITY) 1.5 CV/8.9FY SOPN; Inject 1.5 mg into the skin once a week. Every thursday  Dispense: 2 mL; Refill: 0  -Refill metFORMIN (GLUCOPHAGE) 500 MG tablet; Take 1 tablet (500 mg total) by mouth daily with lunch.  Dispense: 30 tablet; Refill: 0  - CMP14+EGFR - Hemoglobin A1c - Insulin, random  2. Vitamin D deficiency We will obtain  labs today. We will refill Vit D 50,000 IU once a week for 1 month with 0 refills.  -Refill Vitamin D, Ergocalciferol, (DRISDOL) 1.25 MG (50000 UNIT) CAPS capsule; Take 1 capsule (50,000 Units total) by mouth 2 (two) times a week. Q Thurs and Mon  Dispense: 10 capsule; Refill: 0  - VITAMIN D 25 Hydroxy (Vit-D Deficiency, Fractures)  3. Vitamin B 12 deficiency We will obtain labs today.  - Vitamin B12  4. Other hyperlipidemia We will obtain labs today. Continue eating plan and exercise.  - Lipid Panel With LDL/HDL Ratio  5. Class 3 severe obesity with serious comorbidity and body mass index (BMI) 68.7 James Gross is currently in the action stage of change. As such, his goal is to continue with weight loss efforts. He has agreed to keeping a food journal and adhering to recommended goals of 2100-2200 calories and 160+ grams of protein daily.   Handouts given: High protein food options.  Exercise goals: All adults should avoid inactivity. Some physical activity is better than none, and adults who participate in any amount of physical activity gain some health benefits.  Behavioral modification strategies: increasing lean protein intake and decreasing simple carbohydrates.  James Gross has agreed to follow-up with our clinic in 3 weeks. He was informed of the importance of frequent follow-up visits to maximize his success with intensive lifestyle modifications for his multiple health conditions.   James Gross was informed we would discuss his lab results at his next visit unless  there is a critical issue that needs to be addressed sooner. James Gross agreed to keep his next visit at the agreed upon time to discuss these results.  Objective:   Blood pressure 134/72, pulse 90, temperature 98.1 F (36.7 C), height _0  (1.727 m), weight (!) 451 lb (204.6 kg), SpO2 99 %. Body mass index is 68.57 kg/m.  General: Cooperative, alert, well developed, in no acute distress. HEENT: Conjunctivae and lids  unremarkable. Cardiovascular: Regular rhythm.  Lungs: Normal work of breathing. Neurologic: No focal deficits.   Lab Results  Component Value Date   CREATININE 0.81 06/03/2022   BUN 15 06/03/2022   NA 140 06/03/2022   K 4.5 06/03/2022   CL 103 06/03/2022   CO2 23 06/03/2022   Lab Results  Component Value Date   ALT 38 06/03/2022   AST 22 06/03/2022   ALKPHOS 85 06/03/2022   BILITOT <0.2 06/03/2022   Lab Results  Component Value Date   HGBA1C 6.0 (H) 06/03/2022   HGBA1C 5.6 12/11/2021   HGBA1C 5.8 (H) 06/18/2021   HGBA1C 5.6 02/18/2021   HGBA1C 5.9 (H) 11/01/2020   Lab Results  Component Value Date   INSULIN 68.2 (H) 06/03/2022   INSULIN 39.9 (H) 03/05/2022   INSULIN 35.2 (H) 06/18/2021   INSULIN 26.3 (H) 02/18/2021   INSULIN 34.4 (H) 11/01/2020   Lab Results  Component Value Date   TSH 4.490 06/18/2021   Lab Results  Component Value Date   CHOL 195 06/03/2022   HDL 53 06/03/2022   LDLCALC 123 (H) 06/03/2022   TRIG 107 06/03/2022   CHOLHDL 4.3 11/01/2020   Lab Results  Component Value Date   VD25OH 15.2 (L) 06/03/2022   VD25OH 17.5 (L) 03/05/2022   VD25OH 16.5 (L) 12/11/2021   Lab Results  Component Value Date   WBC 4.6 06/18/2021   HGB 14.0 06/18/2021   HCT 43.0 06/18/2021   MCV 80 06/18/2021   PLT 285 06/18/2021   No results found for: "IRON", "TIBC", "FERRITIN"  Attestation Statements:   Reviewed by clinician on day of visit: allergies, medications, problem list, medical history, surgical history, family history, social history, and previous encounter notes.  Time spent on visit including pre-visit chart review and post-visit care and charting was 30 minutes.   I, James Gross, am acting as transcriptionist for [employer], PA.  I have reviewed the above documentation for accuracy and completeness, and I agree with the above. -  James Drapeau,PA-C

## 2022-06-18 ENCOUNTER — Ambulatory Visit (INDEPENDENT_AMBULATORY_CARE_PROVIDER_SITE_OTHER): Payer: Medicaid Other | Admitting: Family Medicine

## 2022-06-18 ENCOUNTER — Encounter (INDEPENDENT_AMBULATORY_CARE_PROVIDER_SITE_OTHER): Payer: Self-pay | Admitting: Family Medicine

## 2022-06-18 VITALS — BP 138/74 | HR 101 | Temp 98.2°F | Ht 68.0 in | Wt >= 6400 oz

## 2022-06-18 DIAGNOSIS — E119 Type 2 diabetes mellitus without complications: Secondary | ICD-10-CM

## 2022-06-18 DIAGNOSIS — E1169 Type 2 diabetes mellitus with other specified complication: Secondary | ICD-10-CM | POA: Diagnosis not present

## 2022-06-18 DIAGNOSIS — E669 Obesity, unspecified: Secondary | ICD-10-CM

## 2022-06-18 DIAGNOSIS — K760 Fatty (change of) liver, not elsewhere classified: Secondary | ICD-10-CM | POA: Diagnosis not present

## 2022-06-18 DIAGNOSIS — E785 Hyperlipidemia, unspecified: Secondary | ICD-10-CM | POA: Diagnosis not present

## 2022-06-18 DIAGNOSIS — E559 Vitamin D deficiency, unspecified: Secondary | ICD-10-CM | POA: Diagnosis not present

## 2022-06-18 DIAGNOSIS — Z6841 Body Mass Index (BMI) 40.0 and over, adult: Secondary | ICD-10-CM

## 2022-06-18 DIAGNOSIS — E7849 Other hyperlipidemia: Secondary | ICD-10-CM

## 2022-06-18 DIAGNOSIS — Z7984 Long term (current) use of oral hypoglycemic drugs: Secondary | ICD-10-CM

## 2022-06-18 DIAGNOSIS — E66813 Obesity, class 3: Secondary | ICD-10-CM

## 2022-06-18 DIAGNOSIS — E538 Deficiency of other specified B group vitamins: Secondary | ICD-10-CM

## 2022-06-18 MED ORDER — VITAMIN D (ERGOCALCIFEROL) 1.25 MG (50000 UNIT) PO CAPS: 50000.0000 [IU] | ORAL_CAPSULE | ORAL | 0 refills | Status: DC

## 2022-06-18 MED ORDER — METFORMIN HCL 500 MG PO TABS: 500.0000 mg | ORAL_TABLET | Freq: Two times a day (BID) | ORAL | 0 refills | Status: DC

## 2022-06-18 MED ORDER — CYANOCOBALAMIN 500 MCG PO TABS: ORAL_TABLET | ORAL | Status: AC

## 2022-07-01 NOTE — Progress Notes (Signed)
Chief Complaint:   OBESITY James Gross is here to discuss his progress with his obesity treatment plan along with follow-up of his obesity related diagnoses. James Gross is on keeping a food journal and adhering to recommended goals of 2100-2200 calories and 160+ protein and states he is following his eating plan approximately 0% of the time. James Gross states he is not exercising.  Today's visit was #: 43 Starting weight: 471 lbs Starting date: 02/28/2020 Today's weight: 454 lbs Today's date: 06/18/2022 Total lbs lost to date: 17 lbs Total lbs lost since last in-office visit: +3 lbs  Interim History: Did not really enjoy his Thanksgiving  "cooking.". He is still not taking any of his medications on a regular basis.  Positive for a lot of craving, but no hunger most days.  No exercise.  Here to review labs drawn last office visit.  Subjective:   1. Type 2 diabetes mellitus with obesity (HCC) Worsening.  Discussed labs with patient today.  Patient not taking any medications.  Worsening A1c from 5.6 to 6.0.  Fasting insulin jumped to 68.  2. Hyperlipidemia associated with type 2 diabetes mellitus (HCC) Worsening.  Discussed labs with patient today. LDL 123, triglycerides and HDL are within normal limits.  Patient has not taken any medication.  3. Vitamin D deficiency Worsening.  Discussed labs with patient today. Vitamin D at 15.2, from 17.5.  Not taking supplement as directed.  4. NAFLD (nonalcoholic fatty liver disease) Discussed labs with patient today. CMP within normal limits.  No elevation in ALT or AST.  5. Vitamin B 12 deficiency Worsening.  Discussed labs with patient today. Patient has not taken B12.  Positive for fatigue.  Assessment/Plan:  No orders of the defined types were placed in this encounter.   Medications Discontinued During This Encounter  Medication Reason   metFORMIN (GLUCOPHAGE) 500 MG tablet Reorder   Vitamin D, Ergocalciferol, (DRISDOL) 1.25 MG (50000 UNIT)  CAPS capsule Reorder   cyanocobalamin (VITAMIN B12) 500 MCG tablet Reorder     Meds ordered this encounter  Medications   metFORMIN (GLUCOPHAGE) 500 MG tablet    Sig: Take 1 tablet (500 mg total) by mouth 2 (two) times daily with a meal.    Dispense:  60 tablet    Refill:  0   Vitamin D, Ergocalciferol, (DRISDOL) 1.25 MG (50000 UNIT) CAPS capsule    Sig: Take 1 capsule (50,000 Units total) by mouth 2 (two) times a week. Q Thurs and Mon    Dispense:  10 capsule    Refill:  0    30 d supply;  ** OV for RF **   Do not send RF request   cyanocobalamin (VITAMIN B12) 500 MCG tablet    Sig: 500 mcg po qd OTC     1. Type 2 diabetes mellitus with obesity (HCC) Patient is to start Trulicity, already have a prescription for it.  Increase metformin to twice daily from 1 daily.- Counseled patient on pathophysiology of disease and discussed how good blood sugar control is important to decrease the risk of diabetic complications such as nephropathy, neuropathy, limb loss, blindness, coronary artery disease, etc.   - Intensive lifestyle modification including diet, exercise and weight loss are the first line of treatment for diabetes. We extensively discussed the importance of decreasing simple carbs and how certain foods they eat will affect their blood sugars - Reminded James Gross if he feels poorly- check Blood Sugar and Blood Pressure at that time.    -  Hypoglycemia prevention discussed with the patient.  Eat on a regular basis- no skipping or going long periods without eating.     - Recommend that any concerns about medicines should be directed at the prescribing provider - Recheck labs in 3 months if not done at Endo provider / PCP.  - Importance of f/up with PCP and all other specialists, as scheduled, was stressed to the patient today.  Increase - metFORMIN (GLUCOPHAGE) 500 MG tablet; Take 1 tablet (500 mg total) by mouth 2 (two) times daily with a meal.  Dispense: 60 tablet; Refill: 0  2.  Hyperlipidemia associated with type 2 diabetes mellitus (HCC) LDL not at goal.  Continue with PNP, weight loss.  Continue to decrease saturated and trans fats.  3. Vitamin D deficiency Low Vitamin D level contributes to fatigue and are associated with obesity, breast, and colon cancer. He agrees to continue to take prescription Vitamin D @50 ,000 IU 2 times every week and will follow-up for routine testing of Vitamin D, at least 2-3 times per year to avoid over-replacement.  Start- Vitamin D, Ergocalciferol, (DRISDOL) 1.25 MG (50000 UNIT) CAPS capsule; Take 1 capsule (50,000 Units total) by mouth 2 (two) times a week. Q Thurs and Mon  Dispense: 10 capsule; Refill: 0  4. NAFLD (nonalcoholic fatty liver disease) ALT and AST within normal limits.  Continue PNP and weight loss.  Plan:  Lab results reviewed with patient.  Disease counseling done.  Intensive lifestyle modifications are the first line treatment for this issue.  We discussed several lifestyle modifications today and he will continue to work on diet, exercise and weight loss efforts.   Counseling: NAFLD is an umbrella term that encompasses a disease spectrum that includes steatosis (fat) without inflammation, steatohepatitis (NASH; fat + inflammation in a characteristic pattern), and cirrhosis. Bland steatosis is felt to be a benign condition, with extremely low to no risk of progression to cirrhosis, whereas NASH can progress to cirrhosis. The mainstay of treatment of NAFLD includes lifestyle modification to achieve weight loss, at least 7% of current body weight. Low carbohydrate diets can be beneficial in improving NAFLD liver histology. Additionally, exercise, even the absence of weight loss can have beneficial effects on the patient's metabolic profile and liver health. We recommend that their metabolic comorbidities be aggressively managed, as patients with NAFLD are at increased risk of coronary artery disease.   5. Vitamin B 12  deficiency Vitamin B12 to low at 352.  Patient not taking his supplements and is not worse.  Start 500 mcg B12 daily OTC.  Start- cyanocobalamin (VITAMIN B12) 500 MCG tablet; 500 mcg po qd OTC  6. Obesity, current BMI 69.1 Patient plans on losing 10 pounds in the next 3 to 4 weeks.  James Gross is currently in the action stage of change. As such, his goal is to continue with weight loss efforts. He has agreed to keeping a food journal and adhering to recommended goals of 2100-2200 calories and 160+ protein.   Exercise goals: All adults should avoid inactivity. Some physical activity is better than none, and adults who participate in any amount of physical activity gain some health benefits.  Behavioral modification strategies: increasing lean protein intake, decreasing simple carbohydrates, and keeping a strict food journal.  James Gross has agreed to follow-up with our clinic in 3 weeks. He was informed of the importance of frequent follow-up visits to maximize his success with intensive lifestyle modifications for his multiple health conditions.   Objective:   Blood pressure  138/74, pulse (!) 101, temperature 98.2 F (36.8 C), height 5\' 8"  (1.727 m), weight (!) 454 lb 9.6 oz (206.2 kg), SpO2 98 %. Body mass index is 69.12 kg/m.  General: Cooperative, alert, well developed, in no acute distress. HEENT: Conjunctivae and lids unremarkable. Cardiovascular: Regular rhythm.  Lungs: Normal work of breathing. Neurologic: No focal deficits.   Lab Results  Component Value Date   CREATININE 0.81 06/03/2022   BUN 15 06/03/2022   NA 140 06/03/2022   K 4.5 06/03/2022   CL 103 06/03/2022   CO2 23 06/03/2022   Lab Results  Component Value Date   ALT 38 06/03/2022   AST 22 06/03/2022   ALKPHOS 85 06/03/2022   BILITOT <0.2 06/03/2022   Lab Results  Component Value Date   HGBA1C 6.0 (H) 06/03/2022   HGBA1C 5.6 12/11/2021   HGBA1C 5.8 (H) 06/18/2021   HGBA1C 5.6 02/18/2021   HGBA1C 5.9 (H)  11/01/2020   Lab Results  Component Value Date   INSULIN 68.2 (H) 06/03/2022   INSULIN 39.9 (H) 03/05/2022   INSULIN 35.2 (H) 06/18/2021   INSULIN 26.3 (H) 02/18/2021   INSULIN 34.4 (H) 11/01/2020   Lab Results  Component Value Date   TSH 4.490 06/18/2021   Lab Results  Component Value Date   CHOL 195 06/03/2022   HDL 53 06/03/2022   LDLCALC 123 (H) 06/03/2022   TRIG 107 06/03/2022   CHOLHDL 4.3 11/01/2020   Lab Results  Component Value Date   VD25OH 15.2 (L) 06/03/2022   VD25OH 17.5 (L) 03/05/2022   VD25OH 16.5 (L) 12/11/2021   Lab Results  Component Value Date   WBC 4.6 06/18/2021   HGB 14.0 06/18/2021   HCT 43.0 06/18/2021   MCV 80 06/18/2021   PLT 285 06/18/2021   No results found for: "IRON", "TIBC", "FERRITIN"  Attestation Statements:   Reviewed by clinician on day of visit: allergies, medications, problem list, medical history, surgical history, family history, social history, and previous encounter notes.  I, 06/20/2021, RMA, am acting as [employer] for Energy manager, DO.   I have reviewed the above documentation for accuracy and completeness, and I agree with the above. [employer], D.O.  The 21st Century Cures Act was signed into law in 2016 which includes the topic of electronic health records.  This provides immediate access to information in MyChart.  This includes consultation notes, operative notes, office notes, lab results and pathology reports.  If you have any questions about what you read please let 2017 know at your next visit so we can discuss your concerns and take corrective action if need be.  We are right here with you.

## 2022-07-15 ENCOUNTER — Ambulatory Visit (INDEPENDENT_AMBULATORY_CARE_PROVIDER_SITE_OTHER): Payer: Medicaid Other | Admitting: Family Medicine

## 2022-07-29 ENCOUNTER — Ambulatory Visit (INDEPENDENT_AMBULATORY_CARE_PROVIDER_SITE_OTHER): Payer: Medicaid Other | Admitting: Family Medicine

## 2022-07-29 ENCOUNTER — Encounter (INDEPENDENT_AMBULATORY_CARE_PROVIDER_SITE_OTHER): Payer: Self-pay | Admitting: Family Medicine

## 2022-07-29 VITALS — BP 129/78 | HR 87 | Temp 98.4°F | Ht 68.0 in | Wt >= 6400 oz

## 2022-07-29 DIAGNOSIS — E1169 Type 2 diabetes mellitus with other specified complication: Secondary | ICD-10-CM

## 2022-07-29 DIAGNOSIS — Z7984 Long term (current) use of oral hypoglycemic drugs: Secondary | ICD-10-CM

## 2022-07-29 DIAGNOSIS — E669 Obesity, unspecified: Secondary | ICD-10-CM

## 2022-07-29 DIAGNOSIS — E559 Vitamin D deficiency, unspecified: Secondary | ICD-10-CM

## 2022-07-29 DIAGNOSIS — Z6841 Body Mass Index (BMI) 40.0 and over, adult: Secondary | ICD-10-CM | POA: Diagnosis not present

## 2022-07-29 MED ORDER — METFORMIN HCL 500 MG PO TABS: 500.0000 mg | ORAL_TABLET | Freq: Two times a day (BID) | ORAL | 0 refills | Status: DC

## 2022-07-29 MED ORDER — VITAMIN D (ERGOCALCIFEROL) 1.25 MG (50000 UNIT) PO CAPS: 50000.0000 [IU] | ORAL_CAPSULE | ORAL | 0 refills | Status: DC

## 2022-07-30 ENCOUNTER — Ambulatory Visit (INDEPENDENT_AMBULATORY_CARE_PROVIDER_SITE_OTHER): Payer: Medicaid Other | Admitting: Family Medicine

## 2022-08-13 ENCOUNTER — Ambulatory Visit (INDEPENDENT_AMBULATORY_CARE_PROVIDER_SITE_OTHER): Payer: Medicaid Other | Admitting: Family Medicine

## 2022-08-18 NOTE — Progress Notes (Signed)
Chief Complaint:   OBESITY James Gross is here to discuss his progress with his obesity treatment plan along with follow-up of his obesity related diagnoses. James Gross is on keeping a food journal and adhering to recommended goals of 2100-2200 calories and 160+ grams of protein and states he is following his eating plan approximately 0% of the time. James Gross states he is exercising 0 minutes 0 times per week.  Today's visit was #: 28 Starting weight: 471 lbs Starting date: 02/28/2020 Today's weight: 459 lbs Today's date: 07/29/2022 Total lbs lost to date: 12 lbs Total lbs lost since last in-office visit: 0  Interim History: James Gross had trouble getting back on plan and motivated.  Not taking any of his medications.  Did talk with Mom about his desire to restart meal plan and lose weight again.  She is supportive and will cook for him once again.  Eating out daily.  Subjective:   1. Type 2 diabetes mellitus with obesity (HCC) James Gross  has not seen PCP in some time now but states he has plenty of Trulicity.  A1c 6.0 on 06/03/22.  2. Vitamin D deficiency Vit D level way too low but James Gross is not taking medication as written.  15.2 on 06/03/22.  Assessment/Plan:   1. Type 2 diabetes mellitus with obesity (HCC) Will refill Metformin and take as written.  Risk and benefits medications reviewed with patient for both DM medications.  Continue Prescribed Nutrition Plan and exercise to promote weight loss.   -Refill metFORMIN (GLUCOPHAGE) 500 MG tablet; Take 1 tablet (500 mg total) by mouth 2 (two) times daily with a meal.  Dispense: 60 tablet; Refill: 0  2. Vitamin D deficiency Will refill ergocalciferol once a week for 1 month with 0 refills.  Risk benefits discussed with patient.  Encouraged compliance.  -Refill Vitamin D, Ergocalciferol, (DRISDOL) 1.25 MG (50000 UNIT) CAPS capsule; Take 1 capsule (50,000 Units total) by mouth 2 (two) times a week. Q Thurs and Mon  Dispense: 10 capsule; Refill: 0  3.  Obesity, current BMI 69.9 James Gross is currently in the action stage of change. As such, his goal is to continue with weight loss efforts. He has agreed to the Category 4 Plan and keeping a food journal and adhering to recommended goals of 2100-2200 calories and 60+grams of protein.   Do not drink calories  2. Cut down eating out to 2 days per week.  3. Take medications everyday!  Exercise goals: All adults should avoid inactivity. Some physical activity is better than none, and adults who participate in any amount of physical activity gain some health benefits.  Behavioral modification strategies: increasing lean protein intake, decreasing eating out, meal planning and cooking strategies, and avoiding temptations.  James Gross has agreed to follow-up with our clinic in 3 weeks. He was informed of the importance of frequent follow-up visits to maximize his success with intensive lifestyle modifications for his multiple health conditions.   Objective:   Blood pressure 129/78, pulse 87, temperature 98.4 F (36.9 C), height '5\' 8"'$  (1.727 m), weight (!) 459 lb 12.8 oz (208.6 kg), SpO2 97 %. Body mass index is 69.91 kg/m.  General: Cooperative, alert, well developed, in no acute distress. HEENT: Conjunctivae and lids unremarkable. Cardiovascular: Regular rhythm.  Lungs: Normal work of breathing. Neurologic: No focal deficits.   Lab Results  Component Value Date   CREATININE 0.81 06/03/2022   BUN 15 06/03/2022   NA 140 06/03/2022   K 4.5 06/03/2022   CL 103  06/03/2022   CO2 23 06/03/2022   Lab Results  Component Value Date   ALT 38 06/03/2022   AST 22 06/03/2022   ALKPHOS 85 06/03/2022   BILITOT <0.2 06/03/2022   Lab Results  Component Value Date   HGBA1C 6.0 (H) 06/03/2022   HGBA1C 5.6 12/11/2021   HGBA1C 5.8 (H) 06/18/2021   HGBA1C 5.6 02/18/2021   HGBA1C 5.9 (H) 11/01/2020   Lab Results  Component Value Date   INSULIN 68.2 (H) 06/03/2022   INSULIN 39.9 (H) 03/05/2022   INSULIN  35.2 (H) 06/18/2021   INSULIN 26.3 (H) 02/18/2021   INSULIN 34.4 (H) 11/01/2020   Lab Results  Component Value Date   TSH 4.490 06/18/2021   Lab Results  Component Value Date   CHOL 195 06/03/2022   HDL 53 06/03/2022   LDLCALC 123 (H) 06/03/2022   TRIG 107 06/03/2022   CHOLHDL 4.3 11/01/2020   Lab Results  Component Value Date   VD25OH 15.2 (L) 06/03/2022   VD25OH 17.5 (L) 03/05/2022   VD25OH 16.5 (L) 12/11/2021   Lab Results  Component Value Date   WBC 4.6 06/18/2021   HGB 14.0 06/18/2021   HCT 43.0 06/18/2021   MCV 80 06/18/2021   PLT 285 06/18/2021   No results found for: "IRON", "TIBC", "FERRITIN"  Attestation Statements:   Reviewed by clinician on day of visit: allergies, medications, problem list, medical history, surgical history, family history, social history, and previous encounter notes.  I, Verline Lema Tyus, CMA, am acting as transcriptionist for [employer], DO.

## 2022-08-20 ENCOUNTER — Ambulatory Visit (INDEPENDENT_AMBULATORY_CARE_PROVIDER_SITE_OTHER): Payer: Medicaid Other | Admitting: Family Medicine

## 2022-09-08 ENCOUNTER — Ambulatory Visit (INDEPENDENT_AMBULATORY_CARE_PROVIDER_SITE_OTHER): Payer: Medicaid Other | Admitting: Family Medicine

## 2022-09-08 ENCOUNTER — Encounter (INDEPENDENT_AMBULATORY_CARE_PROVIDER_SITE_OTHER): Payer: Self-pay | Admitting: Family Medicine

## 2022-09-08 VITALS — BP 144/83 | HR 82 | Temp 98.6°F | Ht 68.0 in | Wt >= 6400 oz

## 2022-09-08 DIAGNOSIS — R4589 Other symptoms and signs involving emotional state: Secondary | ICD-10-CM | POA: Diagnosis not present

## 2022-09-08 DIAGNOSIS — Z7984 Long term (current) use of oral hypoglycemic drugs: Secondary | ICD-10-CM

## 2022-09-08 DIAGNOSIS — G479 Sleep disorder, unspecified: Secondary | ICD-10-CM | POA: Insufficient documentation

## 2022-09-08 DIAGNOSIS — E669 Obesity, unspecified: Secondary | ICD-10-CM

## 2022-09-08 DIAGNOSIS — E1169 Type 2 diabetes mellitus with other specified complication: Secondary | ICD-10-CM

## 2022-09-08 DIAGNOSIS — E559 Vitamin D deficiency, unspecified: Secondary | ICD-10-CM

## 2022-09-08 DIAGNOSIS — Z7985 Long-term (current) use of injectable non-insulin antidiabetic drugs: Secondary | ICD-10-CM

## 2022-09-08 DIAGNOSIS — Z6841 Body Mass Index (BMI) 40.0 and over, adult: Secondary | ICD-10-CM | POA: Insufficient documentation

## 2022-09-08 NOTE — Progress Notes (Signed)
Marjory Sneddon, D.O.  ABFM, ABOM Specializing in Clinical Bariatric Medicine Office located at: [address], [place]  [address]    WEIGHT SUMMARY AND BIOMETRICS  Weight Lost Since Last Visit: +10lb  No data recorded  Vitals Temp: 98.6 F (37 C) BP: (!) 144/83 Pulse Rate: 82 SpO2: 96 %   Anthropometric Measurements Height: '5\' 8"'$  (1.727 m) Weight: (!) 469 lb 12.8 oz (213.1 kg) BMI (Calculated): 71.45 Weight at Last Visit: 459lb Weight Lost Since Last Visit: +10lb Starting Weight: 471lb Total Weight Loss (lbs): 12 lb (5.443 kg) Peak Weight: 469lb   Body Composition  Body Fat %: 58.6 % Fat Mass (lbs): 275.4 lbs Muscle Mass (lbs): 185 lbs Visceral Fat Rating : 53   Other Clinical Data Fasting: yes Labs: no Today's Visit #: 14 Starting Date: 02/28/20     HPI  Chief Complaint: OBESITY  James Gross is here to discuss his progress with his obesity treatment plan. He is on the the Category 4 Plan and states he is following his eating plan approximately 0 % of the time. He states he is exercising 0 minutes 0 days per week.   Interval History:  Since last office visit he has been feeling down about his dietary choices. He has had difficulty making healthier choices, often overeating and indulging in cravings. He states that he has not been taking his Trulicity and Metformin. He often forgets to take it as it is not well incorporated into his daily routine. He is also not taking vitamin D. He states that he gets discouraged when he cannot fit certain clothes or perform certain activities easily. He feels that his dysphoric mood definitely contributes to these concerns.  He is eating 1 meal out at least 6 days a week, typically his lunch around 4PM.  He has been staying up until 2AM then sleeping until 1PM. He states that he works on his school work during the day and watches TV in the evenings as a reward.    PHYSICAL EXAM:  Blood pressure (!) 144/83,  pulse 82, temperature 98.6 F (37 C), height '5\' 8"'$  (1.727 m), weight (!) 469 lb 12.8 oz (213.1 kg), SpO2 96 %. Body mass index is 71.43 kg/m.  General: Well Developed, well nourished, and in no acute distress.  HEENT: Normocephalic, atraumatic Skin: Warm and dry, cap RF less 2 sec, good turgor Chest:  Normal excursion, shape, no gross abn Respiratory: speaking in full sentences, no conversational dyspnea NeuroM-Sk: Ambulates w/o assistance, moves * 4 Psych: A and O *3, insight good, mood-full   DIAGNOSTIC DATA REVIEWED:  BMET    Component Value Date/Time   NA 140 06/03/2022 0847   K 4.5 06/03/2022 0847   CL 103 06/03/2022 0847   CO2 23 06/03/2022 0847   GLUCOSE 103 (H) 06/03/2022 0847   BUN 15 06/03/2022 0847   CREATININE 0.81 06/03/2022 0847   CALCIUM 9.4 06/03/2022 0847   GFRNONAA 141 02/28/2020 1104   GFRAA 163 02/28/2020 1104   Lab Results  Component Value Date   HGBA1C 6.0 (H) 06/03/2022   HGBA1C 5.9 07/27/2012   Lab Results  Component Value Date   INSULIN 68.2 (H) 06/03/2022   INSULIN 80.6 (H) 02/28/2020   Lab Results  Component Value Date   TSH 4.490 06/18/2021   CBC    Component Value Date/Time   WBC 4.6 06/18/2021 0953   RBC 5.39 06/18/2021 0953   HGB 14.0 06/18/2021 0953   HCT 43.0 06/18/2021 0953  PLT 285 06/18/2021 0953   MCV 80 06/18/2021 0953   MCH 26.0 (L) 06/18/2021 0953   MCHC 32.6 06/18/2021 0953   RDW 13.9 06/18/2021 0953   Iron Studies No results found for: "IRON", "TIBC", "FERRITIN", "IRONPCTSAT" Lipid Panel     Component Value Date/Time   CHOL 195 06/03/2022 0847   TRIG 107 06/03/2022 0847   HDL 53 06/03/2022 0847   CHOLHDL 4.3 11/01/2020 0933   LDLCALC 123 (H) 06/03/2022 0847   Hepatic Function Panel     Component Value Date/Time   PROT 7.3 06/03/2022 0847   ALBUMIN 4.0 (L) 06/03/2022 0847   AST 22 06/03/2022 0847   ALT 38 06/03/2022 0847   ALKPHOS 85 06/03/2022 0847   BILITOT <0.2 06/03/2022 0847      Component  Value Date/Time   TSH 4.490 06/18/2021 0953   Nutritional Lab Results  Component Value Date   VD25OH 15.2 (L) 06/03/2022   VD25OH 17.5 (L) 03/05/2022   VD25OH 16.5 (L) 12/11/2021     ASSESSMENT AND PLAN  No orders of the defined types were placed in this encounter.   There are no discontinued medications.   No orders of the defined types were placed in this encounter.     TREATMENT PLAN FOR OBESITY:  Recommended Dietary Goals  James Gross is currently in the action stage of change. As such, his goal is to continue weight management plan. He has agreed to continue the Category 4 Plan.  Behavioral Intervention  We discussed the following Behavioral Modification Strategies today: increasing lean protein intake, decreasing simple carbohydrates , increasing vegetables, avoiding skipping meals, increasing water intake, work on meal planning and easy cooking plans, and emotional eating strategies and understanding the difference between hunger signals and cravings.  Additional resources provided today: NA  Recommended Physical Activity Goals  James Gross has been advised to work up to 150 minutes of moderate intensity aerobic activity a week and strengthening exercises 2-3 times per week for cardiovascular health, weight loss maintenance and preservation of muscle mass.   He has agreed to increase physical activity in their day and reduce sedentary time (increase NEAT).  and continue physical activity as is.    Pharmacotherapy We discussed various medication options to help James Gross with his weight loss efforts and we both agreed to continue Trulicity and Metformin. He has been non-compliant with this regimen but is agreeable to resuming them.  ASSOCIATED CONDITIONS ADDRESSED TODAY  Morbid obesity (HCC)-start bmi 71.62/date 02/28/20 Assessment: Condition is Worsening. He has gained 10lbs since 07/29/22. He is journaling daily with recommended goals of 2100-2200 calories and 100+ grams of  protein.  Plan: Advised to follow category 4 plan and continue journaling with same calorie/macro goal. Encouraged meal planning and prepping.  Vitamin D deficiency Assessment: Condition is Stable. His last vitamin D was 15.2 on 06/03/22. Plan: Advised to take the prescribed Vitamin D 50K units twice weekly.   Type 2 diabetes mellitus with obesity (HCC) Assessment: Condition is Stable. His last A1c was 6.0 on 06/03/22. He has not been taking the Trulicity and Metformin. Plan: Advised to take Trulicity and Metformin as prescribed  Depressed mood- emotional eating Assessment: Condition is New. He reports persistent issues with overeating/emotional eating. He is often discouraged by his limitations which causes him to make less healthy choices. Plan: Patient will schedule visit with Dr. Mallie Mussel. I encouraged him to read [employer] and discussed [employer] App for access to the book.  Sleep Disorder Assessment: Condition is Worsening. He often  stays up until 2AM then sleeps until 1PM. This contributes to his dietary habits. Plan: Advised to try blue light glasses as he feels that his daily screentime contributes to his poor sleep hygiene.      Return in about 2 weeks (around 09/22/2022).[name] He was informed of the importance of frequent follow up visits to maximize his success with intensive lifestyle modifications for his multiple health conditions.   ATTESTASTION STATEMENTS:  Reviewed by clinician on day of visit: allergies, medications, problem list, medical history, surgical history, family history, social history, and previous encounter notes.   Time spent on visit including pre-visit chart review and post-visit care and charting was 30 minutes.    I,Alexis Herring,acting as a Education administrator for [employer], DO.,have documented all relevant documentation on the behalf of Mellody Dance, DO,as directed by  Mellody Dance, DO while in the presence of Mellody Dance, DO.   I, Mellody Dance, DO, have reviewed all documentation for this visit. The documentation on 09/08/22 for the exam, diagnosis, procedures, and orders are all accurate and complete.

## 2022-09-15 ENCOUNTER — Telehealth (INDEPENDENT_AMBULATORY_CARE_PROVIDER_SITE_OTHER): Payer: Medicaid Other | Admitting: Psychology

## 2022-09-22 ENCOUNTER — Ambulatory Visit (INDEPENDENT_AMBULATORY_CARE_PROVIDER_SITE_OTHER): Payer: Medicaid Other | Admitting: Family Medicine

## 2022-09-22 ENCOUNTER — Encounter (INDEPENDENT_AMBULATORY_CARE_PROVIDER_SITE_OTHER): Payer: Self-pay | Admitting: Family Medicine

## 2022-09-22 VITALS — BP 134/79 | HR 89 | Temp 98.6°F | Ht 68.0 in | Wt >= 6400 oz

## 2022-09-22 DIAGNOSIS — G4733 Obstructive sleep apnea (adult) (pediatric): Secondary | ICD-10-CM

## 2022-09-22 DIAGNOSIS — E559 Vitamin D deficiency, unspecified: Secondary | ICD-10-CM

## 2022-09-22 DIAGNOSIS — Z6841 Body Mass Index (BMI) 40.0 and over, adult: Secondary | ICD-10-CM

## 2022-09-22 DIAGNOSIS — R4589 Other symptoms and signs involving emotional state: Secondary | ICD-10-CM | POA: Diagnosis not present

## 2022-09-22 DIAGNOSIS — E1169 Type 2 diabetes mellitus with other specified complication: Secondary | ICD-10-CM | POA: Diagnosis not present

## 2022-09-22 MED ORDER — VITAMIN D (ERGOCALCIFEROL) 1.25 MG (50000 UNIT) PO CAPS: 50000.0000 [IU] | ORAL_CAPSULE | ORAL | 0 refills | Status: DC

## 2022-09-22 MED ORDER — TRULICITY 1.5 MG/0.5ML ~~LOC~~ SOAJ: 1.5000 mg | SUBCUTANEOUS | 0 refills | Status: DC

## 2022-09-22 NOTE — Progress Notes (Signed)
James Gross, D.O.  ABFM, ABOM Specializing in Clinical Bariatric Medicine  Office located at: [address], [place]  [address]     Assessment and Plan:   No orders of the defined types were placed in this encounter.   There are no discontinued medications.   No orders of the defined types were placed in this encounter.    Type 2 diabetes mellitus with obesity (HCC) Assessment: Condition is Not at goal.. Labs were reviewed..  Lab Results  Component Value Date   HGBA1C 6.0 (H) 06/03/2022   HGBA1C 5.6 12/11/2021   HGBA1C 5.8 (H) 06/18/2021   INSULIN 68.2 (H) 06/03/2022   INSULIN 39.9 (H) 03/05/2022   INSULIN 35.2 (H) 06/18/2021  He is still non compliant with Metformin and Trulicity- he states that this has not been a priority for him so he forgets to use them.  Plan: Continue the Category 4 Plan and keeping a food journal and adhering to recommended goals of 2100-2200 calories and 60+ protein - Advised to start Metformin 500mg  BID and Trulicity 1.5mg  weekly. - Continue to decrease simple carbs/ sugars; increase fiber and proteins -> follow his meal plan.   - Explained role of simple carbs and insulin levels on hunger and cravings - [name] will continue to work on weight loss, exercise, via their meal plan we devised to help decrease the risk of progressing to diabetes.  - We will recheck A1c and fasting insulin level in approximately 3 months from last check, or as deemed appropriate.    BMI 70 and over, adult (HCC)-current 71.4 Morbid obesity (HCC)-start bmi 71.62/date 02/28/20 Assessment: Condition is Not at goal.. Biometric data collected today, was reviewed with patient.  Fat mass has increased by 1lb. Muscle mass has increased by .2lb.  He has improved adherence to his meal plan over the last x1 week.  However, he has not started using the Metformin and Trulicity.  Plan: Continue the Category 4 Plan and keeping a food journal and adhering to  recommended goals of 2100-2200 calories and 60+ protein Advised against skipping meals. Encouraged him to plan/prep meals for his busier days so that he is still meeting his calorie/protein goals.   Vitamin D deficiency Assessment: Condition is Not at goal.. Labs were reviewed.  Lab Results  Component Value Date   VD25OH 15.2 (L) 06/03/2022   VD25OH 17.5 (L) 03/05/2022   VD25OH 16.5 (L) 12/11/2021  He has not started the Ergocalciferol 50K IU 2 times weekly.  Plan: I provided a refill for the Ergocalciferol 50K IU twice weekly as his last Rx expired before he picked it up from the pharmacy. - I discussed the importance of vitamin D to the patient's health and well-being.  - weight loss will likely improve availability of vitamin D, thus encouraged [name] to continue with meal plan and their weight loss efforts to further improve this condition.  Thus, we will need to monitor levels regularly (every 3-4 mo on average) to keep levels within normal limits and prevent over supplementation. - pt's questions and concerns regarding this condition addressed.     Depressed mood- emotional eating Assessment: Condition is Not optimized.[name] He states that he talked to his mom about his cravings and they do not feel that he this is associated with his mood. He would not like to consider seeing Dr. Mallie Mussel at this time. He reports that his mood is stable and denies any SI/HI. Cravings and hunger are not well controlled. However,  he has not yet started the Metformin or Trulicity.  Plan: Again advised patient to start the Metformin 500mg  BID and Trulicity 1.5mg  weekly- Will refill Trulicity today.   - Reminded patient of the importance of following their prudent nutrition plan and how food can affect mood as well to support emotional wellbeing.  - We will continue to monitor closely alongside PCP / other specialists.    OSA Assessment: Condition is Not optimized.[name]  He reports that most nights he does wear  his CPAP. However, if he falls asleep on the couch or in another room, he will not wear his mask.  Plan:Encouraged him to work on sleep hygiene and encouraged better compliance with CPAP nightly.    TREATMENT PLAN FOR OBESITY:  Recommended Dietary Goals Bayan is currently in the action stage of change. As such, his goal is to continue weight management plan. He has agreed to continue the Category 4 Plan and keeping a food journal and adhering to recommended goals of 2100-2200 calories and 60+ protein.  Behavioral Intervention Additional resources provided today: patient declined Evidence-based interventions for health behavior change were utilized today including the discussion of self monitoring techniques, problem-solving barriers and SMART goal setting techniques.   Regarding patient's less desirable eating habits and patterns, we employed the technique of small changes.  Pt will specifically work on: [employer] and Metformin as prescribed for next visit.    Recommended Physical Activity Goals [name] has been advised to work up to 150 minutes of moderate intensity aerobic activity a week and strengthening exercises 2-3 times per week for cardiovascular health, weight loss maintenance and preservation of muscle mass.  He has agreed to Continue current level of physical activity   FOLLOW UP: No follow-ups on file. He was informed of the importance of frequent follow up visits to maximize his success with intensive lifestyle modifications for his multiple health conditions.  Subjective:   Chief complaint: Obesity Kupono is here to discuss his progress with his obesity treatment plan. He is on the the Category 4 Plan and keeping a food journal and adhering to recommended goals of 2100-2200 calories and 60+ protein and states he is following his eating plan approximately 50% of the time. He states he is not exercising.  Interval History:  EMETERIO KLAES is here for a follow up office  visit. We reviewed his meal plan and all questions were answered. Patient's food recall appears to be accurate and consistent with what is on plan when he is following it. When eating on plan, his hunger and cravings are well controlled.     Since last office visit he states that he did not do well for the first half of his spring break as he was eating off plan while visiting his cousins in [place]. However, he reports better adherence to his meal plan for the last x7 days. He still has not started his medications (Trulicity and Metformin) since last visit. He states that this has not been a priority for him, so he does not think to get the medications out of his medication cabinet.  He has been tracking his foods on my fitness pal. We reviewed his MFP log from this past week: Monday: 1946 calories and 160g protein Tuesday: 1860 calories and 184g protein Wednesday: 2289 calories and 174g protein Thursday: 2104 calories and 158g protein  He states that on Sundays he struggles to eat all of his foods as he is in church for most of the day and  does not pack a lunch.   Review of Systems:  Pertinent positives were addressed with patient today.  Weight Summary and Biometrics   Weight Lost Since Last Visit: 0  Weight Gained Since Last Visit: 2lb   Vitals Temp: 98.6 F (37 C) BP: 134/79 Pulse Rate: 89 SpO2: 98 %   Anthropometric Measurements Height: 5\' 8"  (1.727 m) Weight: (!) 471 lb (213.6 kg) BMI (Calculated): 71.63 Weight at Last Visit: 469LB Weight Lost Since Last Visit: 0 Weight Gained Since Last Visit: 2lb Starting Weight: 471LB Peak Weight: 471lb   Body Composition  Body Fat %: 58.7 % Fat Mass (lbs): 276.4 lbs Muscle Mass (lbs): 185.2 lbs Visceral Fat Rating : 53   Other Clinical Data Fasting: yes Labs: no Today's Visit #: 74 Starting Date: 02/28/20    Objective:   PHYSICAL EXAM:  Blood pressure 134/79, pulse 89, temperature 98.6 F (37 C), height 5\' 8"   (1.727 m), weight (!) 471 lb (213.6 kg), SpO2 98 %. Body mass index is 71.62 kg/m.  General: Well Developed, well nourished, and in no acute distress.  HEENT: Normocephalic, atraumatic Skin: Warm and dry, cap RF less 2 sec, good turgor Chest:  Normal excursion, shape, no gross abn Respiratory: speaking in full sentences, no conversational dyspnea NeuroM-Sk: Ambulates w/o assistance, moves * 4 Psych: A and O *3, insight good, mood-full  DIAGNOSTIC DATA REVIEWED:  BMET    Component Value Date/Time   NA 140 06/03/2022 0847   K 4.5 06/03/2022 0847   CL 103 06/03/2022 0847   CO2 23 06/03/2022 0847   GLUCOSE 103 (H) 06/03/2022 0847   BUN 15 06/03/2022 0847   CREATININE 0.81 06/03/2022 0847   CALCIUM 9.4 06/03/2022 0847   GFRNONAA 141 02/28/2020 1104   GFRAA 163 02/28/2020 1104   Lab Results  Component Value Date   HGBA1C 6.0 (H) 06/03/2022   HGBA1C 5.9 07/27/2012   Lab Results  Component Value Date   INSULIN 68.2 (H) 06/03/2022   INSULIN 80.6 (H) 02/28/2020   Lab Results  Component Value Date   TSH 4.490 06/18/2021   CBC    Component Value Date/Time   WBC 4.6 06/18/2021 0953   RBC 5.39 06/18/2021 0953   HGB 14.0 06/18/2021 0953   HCT 43.0 06/18/2021 0953   PLT 285 06/18/2021 0953   MCV 80 06/18/2021 0953   MCH 26.0 (L) 06/18/2021 0953   MCHC 32.6 06/18/2021 0953   RDW 13.9 06/18/2021 0953   Iron Studies No results found for: "IRON", "TIBC", "FERRITIN", "IRONPCTSAT" Lipid Panel     Component Value Date/Time   CHOL 195 06/03/2022 0847   TRIG 107 06/03/2022 0847   HDL 53 06/03/2022 0847   CHOLHDL 4.3 11/01/2020 0933   LDLCALC 123 (H) 06/03/2022 0847   Hepatic Function Panel     Component Value Date/Time   PROT 7.3 06/03/2022 0847   ALBUMIN 4.0 (L) 06/03/2022 0847   AST 22 06/03/2022 0847   ALT 38 06/03/2022 0847   ALKPHOS 85 06/03/2022 0847   BILITOT <0.2 06/03/2022 0847      Component Value Date/Time   TSH 4.490 06/18/2021 0953    Nutritional Lab Results  Component Value Date   VD25OH 15.2 (L) 06/03/2022   VD25OH 17.5 (L) 03/05/2022   VD25OH 16.5 (L) 12/11/2021    Attestations:   Reviewed by clinician on day of visit: allergies, medications, problem list, medical history, surgical history, family history, social history, and previous encounter notes.    I,Alexis Herring,acting as  a scribe for Mellody Dance, DO.,have documented all relevant documentation on the behalf of Mellody Dance, DO,as directed by  Mellody Dance, DO while in the presence of Mellody Dance, DO.   I, Mellody Dance, DO, have reviewed all documentation for this visit. The documentation on 09/22/22 for the exam, diagnosis, procedures, and orders are all accurate and complete.

## 2022-10-07 ENCOUNTER — Ambulatory Visit (INDEPENDENT_AMBULATORY_CARE_PROVIDER_SITE_OTHER): Payer: Medicaid Other | Admitting: Family Medicine

## 2022-10-07 ENCOUNTER — Encounter (INDEPENDENT_AMBULATORY_CARE_PROVIDER_SITE_OTHER): Payer: Self-pay | Admitting: Family Medicine

## 2022-10-07 VITALS — BP 154/72 | HR 78 | Temp 99.0°F | Ht 68.0 in | Wt >= 6400 oz

## 2022-10-07 DIAGNOSIS — E559 Vitamin D deficiency, unspecified: Secondary | ICD-10-CM

## 2022-10-07 DIAGNOSIS — E538 Deficiency of other specified B group vitamins: Secondary | ICD-10-CM | POA: Diagnosis not present

## 2022-10-07 DIAGNOSIS — E1169 Type 2 diabetes mellitus with other specified complication: Secondary | ICD-10-CM | POA: Diagnosis not present

## 2022-10-07 DIAGNOSIS — E669 Obesity, unspecified: Secondary | ICD-10-CM

## 2022-10-07 DIAGNOSIS — R030 Elevated blood-pressure reading, without diagnosis of hypertension: Secondary | ICD-10-CM

## 2022-10-07 DIAGNOSIS — G479 Sleep disorder, unspecified: Secondary | ICD-10-CM

## 2022-10-07 DIAGNOSIS — Z7985 Long-term (current) use of injectable non-insulin antidiabetic drugs: Secondary | ICD-10-CM

## 2022-10-07 DIAGNOSIS — Z6841 Body Mass Index (BMI) 40.0 and over, adult: Secondary | ICD-10-CM

## 2022-10-07 MED ORDER — TRULICITY 1.5 MG/0.5ML ~~LOC~~ SOAJ: 1.5000 mg | SUBCUTANEOUS | 0 refills | Status: DC

## 2022-10-07 MED ORDER — VITAMIN D (ERGOCALCIFEROL) 1.25 MG (50000 UNIT) PO CAPS: 50000.0000 [IU] | ORAL_CAPSULE | ORAL | 0 refills | Status: DC

## 2022-10-07 NOTE — Progress Notes (Signed)
Carlye Grippeeborah J. Eymi Lipuma, D.O.  ABFM, ABOM Specializing in Clinical Bariatric Medicine  Office located at: [address]  [place], [place]  [phone]     Assessment and Plan:   Medications Discontinued During This Encounter  Medication Reason   Vitamin D, Ergocalciferol, (DRISDOL) 1.25 MG (50000 UNIT) CAPS capsule Reorder   Dulaglutide (TRULICITY) 1.5 MG/0.5ML SOPN Reorder     Meds ordered this encounter  Medications   Dulaglutide (TRULICITY) 1.5 MG/0.5ML SOPN    Sig: Inject 1.5 mg into the skin once a week. Every thursday    Dispense:  6 mL    Refill:  0   Vitamin D, Ergocalciferol, (DRISDOL) 1.25 MG (50000 UNIT) CAPS capsule    Sig: Take 1 capsule (50,000 Units total) by mouth 2 (two) times a week. Q Thurs and Mon    Dispense:  10 capsule    Refill:  0    30 d supply;  ** OV for RF **   Do not send RF request     Vitamin D deficiency Assessment: Condition is Not optimized.  Lab Results  Component Value Date   VD25OH 15.2 (L) 06/03/2022   VD25OH 17.5 (L) 03/05/2022   VD25OH 16.5 (L) 12/11/2021  Patient is irregularly taking Ergocalciferol 50,000 IU twice weekly. Patient is not sure he has Vitamin D at home and asked for another refill because he is not sure he was script for it.   Plan: Will refill Vitamin D today. Continue with Ergocalciferol 50,000 IU weekly to the best of his ability.  - weight loss will likely improve availability of vitamin D, thus encouraged Steele to continue with meal plan and their weight loss efforts to further improve this condition.  Thus, we will need to monitor levels regularly (every 3-4 mo on average) to keep levels within normal limits and prevent over supplementation.   Type 2 diabetes mellitus with obesity  Assessment: Condition is Not optimized.  Lab Results  Component Value Date   HGBA1C 6.0 (H) 06/03/2022   HGBA1C 5.6 12/11/2021   HGBA1C 5.8 (H) 06/18/2021   INSULIN 68.2 (H) 06/03/2022   INSULIN 39.9 (H) 03/05/2022   INSULIN  35.2 (H) 06/18/2021  Patient endorses that his hunger and cravings are controlled when eating according to the meal plan. However, he sometimes has sweet cravings. This week, the patient has not been able to obtain the Trulicity 1.5 mg because he received a message from the pharmacy that it is not in stock. He has Metformin 500 mg BID at home but has not been taking it.   Plan: Will refill Trulicity today.  Continue with the meds to the best of his ability.  - Continue to decrease simple carbs/ sugars; increase fiber and proteins -> follow his meal plan.   [name]- Nehemyah will continue to work on weight loss, exercise, via their meal plan we devised to help decrease the risk of progressing to diabetes.    Sleep Disorder Assessment: Condition is stable. Patient endorses that his sleep has been better. He reports sleeping 8 hrs per night. He is now going before 1 am.   Plan: Continue using CPAP nightly.  Continue his prudent nutritional plan and continue to advance exercise and cardiovascular fitness as tolerated in the future.    Vitamin B 12 deficiency Assessment: Condition is stable. Lab Results  Component Value Date   VITAMINB12 352 06/03/2022  Patient is taking OTC Vitamin B12 500 mch po qd irregularly.  Plan:Continue with med to the  best of his ability.  Continue his prudent nutritional plan and continue to advance exercise and cardiovascular fitness as tolerated.    Elevated blood pressure reading Assessment: Condition is Not optimized. Last 3 blood pressure readings in our office are as follows: BP Readings from Last 3 Encounters:  10/07/22 (!) 154/72  09/22/22 134/79  09/08/22 (!) 144/83  Patient endorses that today's high blood pressure reading is likely due to stress from school project and upcoming exam. Denies any side effects. Asymptomatic.   Plan: Continue with Prudent nutritional plan and low sodium diet, advance exercise as tolerated.   - We will continue to monitor closely  alongside PCP/ specialists.  Pt reminded to also f/up with those individuals as instructed by them.    TREATMENT PLAN FOR OBESITY: BMI 70 and over, adult (HCC)-current 71.6 Morbid obesity (HCC)-start bmi 71.62/date 02/28/20 Assessment: Condition is Improving, but not optimized.. Biometric data collected today, was reviewed with patient.  Fat mass has decreased by 10.4lb. Muscle mass has increased by 3.4lb. Patient endorses taking food scale when eating out.    Plan: Continue with the Category 4 Plan and keeping a food journal and adhering to recommended goals of 2100-2200 calories and 60+ protein. We discussed different meals that he can prepare to hit protein and caloric goals.   Behavioral Intervention Additional resources provided today:  food journal log , seasoning handout Evidence-based interventions for health behavior change were utilized today including the discussion of self monitoring techniques, problem-solving barriers and SMART goal setting techniques.   Regarding patient's less desirable eating habits and patterns, we employed the technique of small changes.  Pt will specifically work on: taking medicines (Trulicity, Metformin, Vitamin D and B12) consistently  for next visit.    Recommended Physical Activity Goals Clayburn Pertvan has been advised to work up to 150 minutes of moderate intensity aerobic activity a week and strengthening exercises 2-3 times per week for cardiovascular health, weight loss maintenance and preservation of muscle mass.  He has agreed to Continue current level of physical activity   FOLLOW UP: Return in about 2 weeks (around 10/21/2022). He was informed of the importance of frequent follow up visits to maximize his success with intensive lifestyle modifications for his multiple health conditions.    Subjective:   Chief complaint: Obesity Clayburn Pertvan is here to discuss his progress with his obesity treatment plan. He is on the the Category 4 Plan and keeping a food  journal and adhering to recommended goals of 2100-2200 calories and 60 protein and states he is following his eating plan approximately 90% of the time. He states he is walking 10 minutes 2 days per week.  Interval History:  Joellen Jerseyvan B Mayfield is here for a follow up office visit. Since last office visit he has been tracking his calories. On average he eats 2150 calories per day, however twice he was under this number and two other times he was over this number. He states that it is not difficult to get all the food in. He endorses he does not snack between meals.   We reviewed his meal plan and all questions were answered. Patient's food recall appears to be accurate and consistent with what is on plan when he is following it. When eating on plan, his hunger and cravings are well controlled.     Pharmacotherapy for weight loss: He is currently taking  Metformin and Trulicity  for medical weight loss.  Denies side effects.    Review of Systems:  Pertinent  positives were addressed with patient today.     Weight Summary and Biometrics   Weight Lost Since Last Visit: 7lb  Vitals Temp: 99 F (37.2 C) BP: (!) 154/72 (manually) Pulse Rate: 78 SpO2: 97 %   Anthropometric Measurements Height: 5\' 8"  (1.727 m) Weight: (!) 464 lb 3.2 oz (210.6 kg) BMI (Calculated): 70.6 Weight at Last Visit: 471lb Weight Lost Since Last Visit: 7lb Starting Weight: 471lb Total Weight Loss (lbs): 7 lb (3.175 kg) Peak Weight: 471lb   Body Composition  Body Fat %: 57.3 % Fat Mass (lbs): 266 lbs Muscle Mass (lbs): 188.6 lbs Visceral Fat Rating : 51   Other Clinical Data Fasting: no Labs: no Today's Visit #: 47 Starting Date: 02/28/20    Objective:   PHYSICAL EXAM:  Blood pressure (!) 154/72, pulse 78, temperature 99 F (37.2 C), height 5\' 8"  (1.727 m), weight (!) 464 lb 3.2 oz (210.6 kg), SpO2 97 %. Body mass index is 70.58 kg/m. General: Well Developed, well nourished, and in no acute  distress.  HEENT: Normocephalic, atraumatic Skin: Warm and dry, cap RF less 2 sec, good turgor Chest:  Normal excursion, shape, no gross abn Respiratory: speaking in full sentences, no conversational dyspnea NeuroM-Sk: Ambulates w/o assistance, moves * 4 Psych: A and O *3, insight good, mood-full   DIAGNOSTIC DATA REVIEWED:  BMET    Component Value Date/Time   NA 140 06/03/2022 0847   K 4.5 06/03/2022 0847   CL 103 06/03/2022 0847   CO2 23 06/03/2022 0847   GLUCOSE 103 (H) 06/03/2022 0847   BUN 15 06/03/2022 0847   CREATININE 0.81 06/03/2022 0847   CALCIUM 9.4 06/03/2022 0847   GFRNONAA 141 02/28/2020 1104   GFRAA 163 02/28/2020 1104   Lab Results  Component Value Date   HGBA1C 6.0 (H) 06/03/2022   HGBA1C 5.9 07/27/2012   Lab Results  Component Value Date   INSULIN 68.2 (H) 06/03/2022   INSULIN 80.6 (H) 02/28/2020   Lab Results  Component Value Date   TSH 4.490 06/18/2021   CBC    Component Value Date/Time   WBC 4.6 06/18/2021 0953   RBC 5.39 06/18/2021 0953   HGB 14.0 06/18/2021 0953   HCT 43.0 06/18/2021 0953   PLT 285 06/18/2021 0953   MCV 80 06/18/2021 0953   MCH 26.0 (L) 06/18/2021 0953   MCHC 32.6 06/18/2021 0953   RDW 13.9 06/18/2021 0953   Iron Studies No results found for: "IRON", "TIBC", "FERRITIN", "IRONPCTSAT" Lipid Panel     Component Value Date/Time   CHOL 195 06/03/2022 0847   TRIG 107 06/03/2022 0847   HDL 53 06/03/2022 0847   CHOLHDL 4.3 11/01/2020 0933   LDLCALC 123 (H) 06/03/2022 0847   Hepatic Function Panel     Component Value Date/Time   PROT 7.3 06/03/2022 0847   ALBUMIN 4.0 (L) 06/03/2022 0847   AST 22 06/03/2022 0847   ALT 38 06/03/2022 0847   ALKPHOS 85 06/03/2022 0847   BILITOT <0.2 06/03/2022 0847      Component Value Date/Time   TSH 4.490 06/18/2021 0953   Nutritional Lab Results  Component Value Date   VD25OH 15.2 (L) 06/03/2022   VD25OH 17.5 (L) 03/05/2022   VD25OH 16.5 (L) 12/11/2021    Attestations:    Reviewed by clinician on day of visit: allergies, medications, problem list, medical history, surgical history, family history, social history, and previous encounter notes.    I,Special Puri,acting as a Neurosurgeon for [employer], DO.,have documented all  relevant documentation on the behalf of Thomasene Lot, DO,as directed by  Thomasene Lot, DO while in the presence of Thomasene Lot, DO.   I, Thomasene Lot, DO, have reviewed all documentation for this visit. The documentation on 10/07/22 for the exam, diagnosis, procedures, and orders are all accurate and complete.

## 2022-10-17 NOTE — Progress Notes (Signed)
HPI M never smoker followed for OSA, complicated by Morbid Obesity, PreDiabetes NPSG 03/27/19  AHI 169.9/ hr, desaturation to 66%, Body weight 430 lbs, CPAP titrated to 16  =====================================================================   10/17/21- 20 yoM never smoker followed for OSA, complicated by Morbid Obesity, DM2,  CPAP auto 12-18/ [facility] Download- compliance 20%, AHI 0.4/ hr Body weight today- 407 lbs Covid vax-2 Moderna Flu vax-no Patient is doing good, no concerns He continues in school with 2 more years studying business at [employer]. Download reviewed.  He admits he often looks at his phone until he falls asleep before putting CPAP on.  We discussed sleep habits and management.  CPAP is clearly the best approach for him and I strongly encouraged him to work for better compliance. Unfortunately he has not made any sustained progress with weight loss. CXR- 09/06/21- IMPRESSION: No acute cardiopulmonary abnormalities.  10/20/22- 21 yoM never smoker followed for OSA, complicated by Morbid Obesity, DM2,  CPAP auto 12-18/ [facility] Download- compliance 23%, AHI 1.3/ hr Body weight today- 467 lbs -----No issues with cpap machine. Pt is doing well Download reviewed.  Still having trouble being compliant with CPAP use.  He says he falls asleep before he puts it on.  I suspect there is a general pattern of for self-discipline and likely significant depression. We discussed the medical reasons for treating sleep apnea, treatment options and the importance of body weight again. 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  ROS-see HPI   + = positive Constitutional:    +diet/weight loss, night sweats, fevers, chills, fatigue, lassitude. HEENT:    headaches, difficulty swallowing, tooth/dental problems, sore throat,       sneezing, itching, ear ache, nasal congestion, post nasal drip, snoring CV:    chest pain, orthopnea, PND, swelling in  lower extremities, anasarca,                                   dizziness, palpitations Resp:   shortness of breath with exertion or at rest.                productive cough,   non-productive cough, coughing up of blood.              change in color of mucus.  wheezing.   Skin:    rash or lesions. GI:  No-   heartburn, indigestion, abdominal pain, nausea, vomiting, diarrhea,                 change in bowel habits, loss of appetite GU: dysuria, change in color of urine, no urgency or frequency.   flank pain. MS:   joint pain, stiffness, decreased range of motion, back pain. Neuro-     nothing unusual Psych:  change in mood or affect.  depression or anxiety.   memory loss.  OBJ- Physical Exam General- Alert, Oriented, Affect-appropriate, Distress- none acute, + morbid obesity Skin- rash-none, lesions- none, excoriation- none Lymphadenopathy- none Head- atraumatic            Eyes- Gross vision intact, PERRLA, conjunctivae and secretions clear            Ears- Hearing, canals-normal            Nose- Clear, no-Septal dev, mucus, polyps, erosion, perforation             Throat- Mallampati III-IV , mucosa clear ,  drainage- none, tonsils+, + teeth Neck- flexible , trachea midline, no stridor , thyroid nl, carotid no bruit Chest - symmetrical excursion , unlabored           Heart/CV- RRR , no murmur , no gallop  , no rub, nl s1 s2                           - JVD- none , edema- none, stasis changes- none, varices- none           Lung- clear to P&A, wheeze- none, cough- none , dullness-none, rub- none           Chest wall-  Abd-  Br/ Gen/ Rectal- Not done, not indicated Extrem- cyanosis- none, clubbing, none, atrophy- none, strength- nl Neuro- grossly intact to observation

## 2022-10-20 ENCOUNTER — Ambulatory Visit (INDEPENDENT_AMBULATORY_CARE_PROVIDER_SITE_OTHER): Payer: Medicaid Other | Admitting: Family Medicine

## 2022-10-20 ENCOUNTER — Ambulatory Visit (INDEPENDENT_AMBULATORY_CARE_PROVIDER_SITE_OTHER): Payer: Medicaid Other | Admitting: Internal Medicine

## 2022-10-20 ENCOUNTER — Encounter: Payer: Self-pay | Admitting: Internal Medicine

## 2022-10-20 ENCOUNTER — Encounter (INDEPENDENT_AMBULATORY_CARE_PROVIDER_SITE_OTHER): Payer: Self-pay | Admitting: Family Medicine

## 2022-10-20 VITALS — BP 158/84 | HR 92 | Temp 98.8°F | Ht 68.0 in | Wt >= 6400 oz

## 2022-10-20 VITALS — BP 120/78 | HR 102 | Ht 68.0 in | Wt >= 6400 oz

## 2022-10-20 DIAGNOSIS — Z6841 Body Mass Index (BMI) 40.0 and over, adult: Secondary | ICD-10-CM | POA: Diagnosis not present

## 2022-10-20 DIAGNOSIS — G4733 Obstructive sleep apnea (adult) (pediatric): Secondary | ICD-10-CM | POA: Diagnosis not present

## 2022-10-20 DIAGNOSIS — R030 Elevated blood-pressure reading, without diagnosis of hypertension: Secondary | ICD-10-CM | POA: Diagnosis not present

## 2022-10-20 DIAGNOSIS — E1169 Type 2 diabetes mellitus with other specified complication: Secondary | ICD-10-CM

## 2022-10-20 DIAGNOSIS — E559 Vitamin D deficiency, unspecified: Secondary | ICD-10-CM | POA: Diagnosis not present

## 2022-10-20 DIAGNOSIS — Z7985 Long-term (current) use of injectable non-insulin antidiabetic drugs: Secondary | ICD-10-CM

## 2022-10-20 DIAGNOSIS — E538 Deficiency of other specified B group vitamins: Secondary | ICD-10-CM | POA: Diagnosis not present

## 2022-10-20 DIAGNOSIS — Z7984 Long term (current) use of oral hypoglycemic drugs: Secondary | ICD-10-CM

## 2022-10-20 MED ORDER — VITAMIN D (ERGOCALCIFEROL) 1.25 MG (50000 UNIT) PO CAPS: 50000.0000 [IU] | ORAL_CAPSULE | ORAL | 0 refills | Status: DC

## 2022-10-20 MED ORDER — TRULICITY 1.5 MG/0.5ML ~~LOC~~ SOAJ: 1.5000 mg | SUBCUTANEOUS | 0 refills | Status: DC

## 2022-10-20 MED ORDER — METFORMIN HCL 500 MG PO TABS: 500.0000 mg | ORAL_TABLET | Freq: Two times a day (BID) | ORAL | 0 refills | Status: DC

## 2022-10-20 NOTE — Progress Notes (Signed)
James Gross, D.O.  ABFM, ABOM Specializing in Clinical Bariatric Medicine  Office located at: [address] [place], [place]  [address]     Assessment and Plan:   No orders of the defined types were placed in this encounter.   Medications Discontinued During This Encounter  Medication Reason   metFORMIN (GLUCOPHAGE) 500 MG tablet Reorder   Dulaglutide (TRULICITY) 1.5 MG/0.5ML SOPN Reorder   Vitamin D, Ergocalciferol, (DRISDOL) 1.25 MG (50000 UNIT) CAPS capsule Reorder     Meds ordered this encounter  Medications   Dulaglutide (TRULICITY) 1.5 MG/0.5ML SOPN    Sig: Inject 1.5 mg into the skin once a week. Every thursday    Dispense:  2 mL    Refill:  0   metFORMIN (GLUCOPHAGE) 500 MG tablet    Sig: Take 1 tablet (500 mg total) by mouth 2 (two) times daily with a meal.    Dispense:  60 tablet    Refill:  0   Vitamin D, Ergocalciferol, (DRISDOL) 1.25 MG (50000 UNIT) CAPS capsule    Sig: Take 1 capsule (50,000 Units total) by mouth 2 (two) times a week. Q Thurs and Mon    Dispense:  10 capsule    Refill:  0    30 d supply;  ** OV for RF **   Do not send RF request   Type 2 diabetes mellitus with obesity Assessment: Condition is Not optimized. Lab Results  Component Value Date   HGBA1C 6.0 (H) 06/03/2022   HGBA1C 5.6 12/11/2021   HGBA1C 5.8 (H) 06/18/2021   INSULIN 68.2 (H) 06/03/2022   INSULIN 39.9 (H) 03/05/2022   INSULIN 35.2 (H) 06/18/2021  Patient has not been compliant with Metformin 500 mg BID and Trulicity 1.5 mg weekly. He states he has not picked these meds up from the pharmacy. Patient endorses that when eating on plan his hunger and cravings are controlled.  Plan:Obtain and start medications. Will refill these meds today.   - Continue to decrease simple carbs/ sugars; increase fiber and proteins -> follow his meal plan.   James Gross will continue to work on weight loss, exercise, via their meal plan we devised to help decrease the risk of progressing  to diabetes.    Elevated blood pressure reading Assessment: Condition is Not optimized. Last 3 blood pressure readings in our office are as follows: BP Readings from Last 3 Encounters:  10/20/22 (!) 158/84  10/07/22 (!) 154/72  09/22/22 134/79  Patient endorses that he is not sure why his blood pressure is high today. He is asymptomatic. He states he has been eating according to the meal plan mostly. He does not check his blood pressure at home and does not have a cuff.    Plan:Continue with Prudent nutritional plan and low sodium diet, advance exercise as tolerated.   -I emphasized that elevated blood pressure may cause damaged kidneys in the future.  -I encouraged him to purchase a blood pressure cuff and check his bp regularly at home.   Vitamin B 12 deficiency Assessment: Condition is not optimized, poorly controlled. Lab Results  Component Value Date   VITAMINB12 352 06/03/2022  Patient has not been compliant with  OTC Vitamin B12 500 mcg po qd. He has not obtained his med from the pharmacy.  Plan: Obtain and start medication Continue his prudent nutritional plan that is low in simple carbohydrates, saturated fats and trans fats to goal of 5-10% weight loss to achieve significant health benefits.  Pt encouraged to continually advance exercise and cardiovascular fitness as tolerated throughout weight loss journey.    Vitamin D deficiency Assessment: Condition is not optimized, poorly controlled. Lab Results  Component Value Date   VD25OH 15.2 (L) 06/03/2022   VD25OH 17.5 (L) 03/05/2022   VD25OH 16.5 (L) 12/11/2021  Patient has not been taking  his Ergocalciferol 50K IU weekly. He endorses he has not picked the med up from the pharmacy.  Plan: Obtain and start meds. Will refill this today.   - I reviewed possible symptoms of low Vitamin D:  low energy, depressed mood, muscle aches, joint aches, osteoporosis etc. was reviewed with patient - weight loss will likely improve  availability of vitamin D, thus encouraged James Gross to continue with meal plan and their weight loss efforts to further improve this condition.  Thus, we will need to monitor levels regularly (every 3-4 mo on average) to keep levels within normal limits and prevent over supplementation.  TREATMENT PLAN FOR OBESITY: BMI 70 and over, adult (HCC)-current 70.6 Morbid obesity (HCC)-start bmi 71.62/date 02/28/20 Assessment: Biometric data was not collected due to issues with the scale.   Plan:  James Gross is currently in the action stage of change. As such, his goal is to continue weight management plan. James Gross will work on healthier eating habits and try their best to continue with the keeping a food journal and adhering to recommended goals of 2100-2200 calories and 160+ protein and using the Category 4 as a guide. I emphasized the importance of eating regularly instead of skipping meals for improving metabolism.  I advised that he could have a protein shake in the mornings for breakfast.   Behavioral Intervention Additional resources provided today: Food journaling plan information Evidence-based interventions for health behavior change were utilized today including the discussion of self monitoring techniques, problem-solving barriers and SMART goal setting techniques.   Regarding patient's less desirable eating habits and patterns, we employed the technique of small changes.  Pt will specifically work on: obtain and starting all meds for next visit.    Recommended Physical Activity Goals James Gross has been advised to work up to 150 minutes of moderate intensity aerobic activity a week and strengthening exercises 2-3 times per week for cardiovascular health, weight loss maintenance and preservation of muscle mass.  He has agreed to Continue current level of physical activity   FOLLOW UP: Return in about 3 weeks (around 11/10/2022). He was informed of the importance of frequent follow up visits to maximize his  success with intensive lifestyle modifications for his multiple health conditions.   Subjective:   Chief complaint: Obesity James Gross is here to discuss his progress with his obesity treatment plan. He is on the keeping a food journal and adhering to recommended goals of 2100-2200 calories and 160+ protein with Category 4 as a guide and states he is following his eating plan approximately 70% of the time. He states he is not exercising.   Interval History:  ARYEH BUTTERFIELD is here for a follow up office visit. Since last office visit he has been following the meal plan 70% of the time. He states that occasionally he has been eating over his caloric range.He did not pick up any of his medications yet. One some days, patient endorses he has an issue with not eating throughout the day and eating most of his calories at night. He sleeps from 1am to 9am. When eating on plan, his hunger and cravings are well controlled.  Pharmacotherapy for weight loss: He has not been taking his Metformin and Trulicity.    Review of Systems:  Pertinent positives were addressed with patient today.   Weight Summary and Biometrics   Weight Lost Since Last Visit: 5  No data recorded   Vitals Temp: 98.8 F (37.1 C) BP: (!) 158/84 Pulse Rate: 92 SpO2: 97 %   Anthropometric Measurements Height:  (1.727 m) Weight: (!) 459 lb (208.2 kg) BMI (Calculated): 69.81 Weight at Last Visit: 464 lb Weight Lost Since Last Visit: 5 Starting Weight: 471 lb Total Weight Loss (lbs): 12 lb (5.443 kg) Peak Weight: 471 lb   No data recorded Other Clinical Data Fasting: yes Labs: no Today's Visit #: 48     Objective:   PHYSICAL EXAM: Blood pressure (!) 158/84, pulse 92, temperature 98.8 F (37.1 C), height  (1.727 m), weight (!) 459 lb (208.2 kg), SpO2 97 %. Body mass index is 69.79 kg/m.  General: Well Developed, well nourished, and in no acute distress.  HEENT: Normocephalic, atraumatic Skin:  Warm and dry, cap RF less 2 sec, good turgor Chest:  Normal excursion, shape, no gross abn Respiratory: speaking in full sentences, no conversational dyspnea NeuroM-Sk: Ambulates w/o assistance, moves * 4 Psych: A and O *3, insight good, mood-full  DIAGNOSTIC DATA REVIEWED:  BMET    Component Value Date/Time   NA 140 06/03/2022 0847   K 4.5 06/03/2022 0847   CL 103 06/03/2022 0847   CO2 23 06/03/2022 0847   GLUCOSE 103 (H) 06/03/2022 0847   BUN 15 06/03/2022 0847   CREATININE 0.81 06/03/2022 0847   CALCIUM 9.4 06/03/2022 0847   GFRNONAA 141 02/28/2020 1104   GFRAA 163 02/28/2020 1104   Lab Results  Component Value Date   HGBA1C 6.0 (H) 06/03/2022   HGBA1C 5.9 07/27/2012   Lab Results  Component Value Date   INSULIN 68.2 (H) 06/03/2022   INSULIN 80.6 (H) 02/28/2020   Lab Results  Component Value Date   TSH 4.490 06/18/2021   CBC    Component Value Date/Time   WBC 4.6 06/18/2021 0953   RBC 5.39 06/18/2021 0953   HGB 14.0 06/18/2021 0953   HCT 43.0 06/18/2021 0953   PLT 285 06/18/2021 0953   MCV 80 06/18/2021 0953   MCH 26.0 (L) 06/18/2021 0953   MCHC 32.6 06/18/2021 0953   RDW 13.9 06/18/2021 0953   Iron Studies No results found for: "IRON", "TIBC", "FERRITIN", "IRONPCTSAT" Lipid Panel     Component Value Date/Time   CHOL 195 06/03/2022 0847   TRIG 107 06/03/2022 0847   HDL 53 06/03/2022 0847   CHOLHDL 4.3 11/01/2020 0933   LDLCALC 123 (H) 06/03/2022 0847   Hepatic Function Panel     Component Value Date/Time   PROT 7.3 06/03/2022 0847   ALBUMIN 4.0 (L) 06/03/2022 0847   AST 22 06/03/2022 0847   ALT 38 06/03/2022 0847   ALKPHOS 85 06/03/2022 0847   BILITOT <0.2 06/03/2022 0847      Component Value Date/Time   TSH 4.490 06/18/2021 0953   Nutritional Lab Results  Component Value Date   VD25OH 15.2 (L) 06/03/2022   VD25OH 17.5 (L) 03/05/2022   VD25OH 16.5 (L) 12/11/2021    Attestations:   Reviewed by clinician on day of visit: allergies,  medications, problem list, medical history, surgical history, family history, social history, and previous encounter notes.   I,Special Puri,acting as a scribe for [employer], DO.,have documented all relevant documentation on the  behalf of Thomasene Lot, DO,as directed by  Thomasene Lot, DO while in the presence of Thomasene Lot, DO.   I, Thomasene Lot, DO, have reviewed all documentation for this visit. The documentation on 10/20/22 for the exam, diagnosis, procedures, and orders are all accurate and complete.

## 2022-10-20 NOTE — Patient Instructions (Signed)
We can continue CPAP auto 12-18  Please try to get consistent putting CPAP on every night as soon as you get in bed. You need to take care of your body- its the only one you will get !

## 2022-11-03 ENCOUNTER — Telehealth: Payer: Self-pay

## 2022-11-03 ENCOUNTER — Telehealth (INDEPENDENT_AMBULATORY_CARE_PROVIDER_SITE_OTHER): Payer: Medicaid Other | Admitting: Family Medicine

## 2022-11-03 ENCOUNTER — Encounter (INDEPENDENT_AMBULATORY_CARE_PROVIDER_SITE_OTHER): Payer: Self-pay | Admitting: Family Medicine

## 2022-11-03 DIAGNOSIS — G4733 Obstructive sleep apnea (adult) (pediatric): Secondary | ICD-10-CM

## 2022-11-03 DIAGNOSIS — E1169 Type 2 diabetes mellitus with other specified complication: Secondary | ICD-10-CM

## 2022-11-03 DIAGNOSIS — E669 Obesity, unspecified: Secondary | ICD-10-CM

## 2022-11-03 DIAGNOSIS — Z7984 Long term (current) use of oral hypoglycemic drugs: Secondary | ICD-10-CM

## 2022-11-03 DIAGNOSIS — Z6841 Body Mass Index (BMI) 40.0 and over, adult: Secondary | ICD-10-CM

## 2022-11-03 MED ORDER — TIRZEPATIDE 2.5 MG/0.5ML ~~LOC~~ SOAJ
2.5000 mg | SUBCUTANEOUS | 0 refills | Status: DC
Start: 2022-11-03 — End: 2022-11-10

## 2022-11-03 NOTE — Progress Notes (Signed)
TeleHealth Visit:  This visit was completed with telemedicine (audio/video) technology. James Gross has verbally consented to this TeleHealth visit. The patient is located at home, the provider is located at home. The participants in this visit include the listed provider and patient. The visit was conducted today via MyChart video.  OBESITY James Gross is here to discuss his progress with his obesity treatment plan along with follow-up of his obesity related diagnoses.   Today's visit was # 49 Starting weight: 471 lbs Starting date: 02/28/20 Weight at last in office visit: 459 lbs on 10/20/22 Total weight loss: 12 lbs at last in office visit on 10/20/22. 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  Recently changed major from business.  He lives with his mom.  This is my first visit with him. He switched to a virtual visit today because he had a final exam to take.  Has done better with plan last 4 weeks.   He is journaling 7 days/week, averaging 150-165 g of protein daily, meeting calorie goals all days except four over the last 2 weeks when he went over on calories. He says that he does not snack much but his portions are large.  Denies regular intake of sugar sweetened beverages. Reports he has eaten out less over the past 4 weeks.  He has history of poor compliance with medications but reports he has taken all of his medication as ordered for the past month except for Trulicity which is unavailable..  He has been off of Trulicity for over a month due to national drug shortage. Assessment/Plan:  1. Type 2 Diabetes Mellitus with other specified complication, without long-term current use of insulin HgbA1c is at goal. Last A1c was 6.0. Medication(s):  He has been off of Trulicity for over a  month due to national drug shortage. Currently taking metformin 500 mg twice daily with meals.  Lab Results  Component Value Date   HGBA1C 6.0 (H) 06/03/2022   HGBA1C 5.6 12/11/2021   HGBA1C 5.8 (H) 06/18/2021   Lab Results  Component Value Date   LDLCALC 123 (H) 06/03/2022   CREATININE 0.81 06/03/2022   No results found for: "GFR"  Plan: New prescription-Mounjaro 2.5 mg weekly. If not covered, will send prescription for Ozempic 0.5 mg weekly.  Carmello denies personal or family history of thyroid cancer, history of pancreatitis, or current cholelithiasis. Amareon was informed of the most common side effects (nausea, constipation, diarrhea).  Continue metformin 500 mg twice daily with meals.  2. Obstructive Sleep Apnea Sehaj has a diagnosis of sleep apnea. He reports that he is using a CPAP regularly.  Positive for fatigue.  Also has very low vitamin D.  Plan: Continue CPAP therapy.  3. Morbid Obesity: Current BMI 69  [name] is currently in the action stage of change. As such, his goal is to continue with weight loss efforts.  He has agreed to keeping a food journal with goal of 2100-2200 calories and 160 grams of protein daily.  1.  Discussed meal ideas. 2.  Encouraged him to have beans, corn, green beans rather than tater tots or [place] fries with meals.  He does not like many vegetables but he will eat these.  Exercise goals: All adults should avoid inactivity. Some physical activity is better than none, and adults  who participate in any amount of physical activity gain some health benefits.  Behavioral modification strategies: decreasing simple carbohydrates , meal planning , and planning for success.  James Gross has agreed to follow-up with our clinic in 2 weeks.   No orders of the defined types were placed in this encounter.   Medications Discontinued During This Encounter  Medication Reason   Dulaglutide (TRULICITY) 1.5 MG/0.5ML SOPN National Drug Shortage     Meds ordered  this encounter  Medications   tirzepatide (MOUNJARO) 2.5 MG/0.5ML Pen    Sig: Inject 2.5 mg into the skin once a week.    Dispense:  2 mL    Refill:  0    Order Specific Question:   Supervising Provider    Answer:   Glennis Brink [[id number]]      Objective:   VITALS: Per patient if applicable, see vitals. GENERAL: Alert and in no acute distress. CARDIOPULMONARY: No increased WOB. Speaking in clear sentences.  PSYCH: Pleasant and cooperative. Speech normal rate and rhythm. Affect is appropriate. Insight and judgement are appropriate. Attention is focused, linear, and appropriate.  NEURO: Oriented as arrived to appointment on time with no prompting.   Attestation Statements:   Reviewed by clinician on day of visit: allergies, medications, problem list, medical history, surgical history, family history, social history, and previous encounter notes.  This was prepared with the assistance of Engineer, civil (consulting).  Occasional wrong-word or sound-a-like substitutions may have occurred due to the inherent limitations of voice recognition software.

## 2022-11-03 NOTE — Telephone Encounter (Signed)
PA submitted through Cover My Meds for [facility]. Awaiting insurance determination. Key: [id number]

## 2022-11-10 MED ORDER — OZEMPIC (0.25 OR 0.5 MG/DOSE) 2 MG/3ML ~~LOC~~ SOPN
0.2500 mg | PEN_INJECTOR | SUBCUTANEOUS | 0 refills | Status: DC
Start: 2022-11-10 — End: 2022-12-29

## 2022-11-10 NOTE — Telephone Encounter (Signed)
Mounjaro denied: Here are the policy requirements your request did not meet: Per your health plan's criteria, this drug is covered if you meet the following: If the request is for a non-preferred drug, you have tried one preferred drug (or your doctor provides clinical reason why you cannot use the drugs): Byetta, Ozempic and Victoza.

## 2022-11-10 NOTE — Addendum Note (Signed)
Addended by: Jesse Sans on: 11/10/2022 04:33 PM   Modules accepted: Orders

## 2022-11-11 ENCOUNTER — Telehealth: Payer: Self-pay

## 2022-11-11 NOTE — Telephone Encounter (Signed)
Received from Cover My Meds: Request Reference Number: [id number]. OZEMPIC INJ 2MG /3ML is approved through 11/11/2023. For further questions, call [employer] at [phone].[name] Authorization Expiration Date: Nov 11, 2023.

## 2022-11-11 NOTE — Telephone Encounter (Signed)
PA submitted through Cover My Meds for Ozempic 

## 2022-11-11 NOTE — Telephone Encounter (Signed)
PA submitted through Cover My Meds for Ozempic. Awaiting insurance determination. Key: [id number]

## 2022-11-17 ENCOUNTER — Ambulatory Visit (INDEPENDENT_AMBULATORY_CARE_PROVIDER_SITE_OTHER): Payer: Medicaid Other | Admitting: Family Medicine

## 2022-11-28 ENCOUNTER — Encounter: Payer: Self-pay | Admitting: Internal Medicine

## 2022-11-28 NOTE — Assessment & Plan Note (Signed)
Gets from CPAP when used consistently but he still has a problem with compliance.  Major excuse is that he simply falls asleep on sofa before he puts the mask on.  We have discussed strategies to get past this. Plan-continue auto 12-18

## 2022-11-28 NOTE — Assessment & Plan Note (Signed)
I think he needs serious external help.  I have discussed availability of [facility].  He is encouraged to discuss options with his primary care provider.

## 2022-12-02 ENCOUNTER — Ambulatory Visit (INDEPENDENT_AMBULATORY_CARE_PROVIDER_SITE_OTHER): Payer: Medicaid Other | Admitting: Family Medicine

## 2022-12-02 ENCOUNTER — Encounter (INDEPENDENT_AMBULATORY_CARE_PROVIDER_SITE_OTHER): Payer: Self-pay | Admitting: Family Medicine

## 2022-12-02 VITALS — BP 126/77 | HR 81 | Temp 98.4°F | Ht 68.0 in | Wt >= 6400 oz

## 2022-12-02 DIAGNOSIS — E559 Vitamin D deficiency, unspecified: Secondary | ICD-10-CM | POA: Diagnosis not present

## 2022-12-02 DIAGNOSIS — Z6841 Body Mass Index (BMI) 40.0 and over, adult: Secondary | ICD-10-CM | POA: Diagnosis not present

## 2022-12-02 DIAGNOSIS — E1165 Type 2 diabetes mellitus with hyperglycemia: Secondary | ICD-10-CM | POA: Diagnosis not present

## 2022-12-02 DIAGNOSIS — E669 Obesity, unspecified: Secondary | ICD-10-CM | POA: Diagnosis not present

## 2022-12-02 DIAGNOSIS — Z7984 Long term (current) use of oral hypoglycemic drugs: Secondary | ICD-10-CM

## 2022-12-02 MED ORDER — VITAMIN D (ERGOCALCIFEROL) 1.25 MG (50000 UNIT) PO CAPS
50000.0000 [IU] | ORAL_CAPSULE | ORAL | 0 refills | Status: DC
Start: 2022-12-02 — End: 2022-12-29

## 2022-12-02 NOTE — Progress Notes (Signed)
Chief Complaint:   OBESITY James Gross is here to discuss his progress with his obesity treatment plan along with follow-up of his obesity related diagnoses. James Gross is on keeping a food journal and adhering to recommended goals of 2100-2200 calories and 160 grams of protein and states he is following his eating plan approximately 0% of the time. James Gross states he is doing 0 minutes 0 times per week.  Today's visit was #: 50 Starting weight: 471 lbs Starting date: 02/28/2020 Today's weight: 471 lbs Today's date: 12/02/2022 Total lbs lost to date: 0 Total lbs lost since last in-office visit: 0  Interim History: Patient has had a great experience with the clinic and Dr. Sharee Holster (this is her patient, first time seeing me).  Hasn't been logging much and wasn't focused on meal plan.  Realizes he hasn't been putting himself first.  Upon review he lost 100 pounds previously and has slowly regained.  He is taking two classes over the summer and is not planning to work.   Subjective:   1. Vitamin D deficiency Patient is on prescription vitamin D.  He denies nausea, vomiting, or muscle weakness but notes fatigue.  2. Type 2 diabetes mellitus with hyperglycemia, without long-term current use of insulin (HCC) Patient is on metformin, and he has not restarted Ozempic yet.  Assessment/Plan:   1. Vitamin D deficiency We will refill prescription vitamin D for 1 month, and we will repeat fasting labs at his next appointment.  - Vitamin D, Ergocalciferol, (DRISDOL) 1.25 MG (50000 UNIT) CAPS capsule; Take 1 capsule (50,000 Units total) by mouth every 7 (seven) days. Q Thurs and Mon  Dispense: 4 capsule; Refill: 0  2. Type 2 diabetes mellitus with hyperglycemia, without long-term current use of insulin (HCC) Patient will continue Ozempic, and we will repeat fasting labs at his next appointment.  3. BMI 70 and over, adult (HCC)  4. Obesity with starting BMI of 71.6 James Gross is currently in the action stage of  change. As such, his goal is to continue with weight loss efforts. He has agreed to keeping a food journal and adhering to recommended goals of 1900 calories and 160+ grams of protein daily.   We will repeat fasting labs at his next appointment.   Exercise goals: No exercise has been prescribed at this time.  Behavioral modification strategies: increasing lean protein intake, meal planning and cooking strategies, planning for success, and keeping a strict food journal.  James Gross has agreed to follow-up with our clinic in 3 weeks with Dr. Sharee Holster. He was informed of the importance of frequent follow-up visits to maximize his success with intensive lifestyle modifications for his multiple health conditions.   Objective:   Blood pressure 126/77, pulse 81, temperature 98.4 F (36.9 C), height 5\' 8"  (1.727 m), weight (!) 471 lb (213.6 kg), SpO2 96 %. Body mass index is 71.62 kg/m.  General: Cooperative, alert, well developed, in no acute distress. HEENT: Conjunctivae and lids unremarkable. Cardiovascular: Regular rhythm.  Lungs: Normal work of breathing. Neurologic: No focal deficits.   Lab Results  Component Value Date   CREATININE 0.81 06/03/2022   BUN 15 06/03/2022   NA 140 06/03/2022   K 4.5 06/03/2022   CL 103 06/03/2022   CO2 23 06/03/2022   Lab Results  Component Value Date   ALT 38 06/03/2022   AST 22 06/03/2022   ALKPHOS 85 06/03/2022   BILITOT <0.2 06/03/2022   Lab Results  Component Value Date   HGBA1C 6.0 (  H) 06/03/2022   HGBA1C 5.6 12/11/2021   HGBA1C 5.8 (H) 06/18/2021   HGBA1C 5.6 02/18/2021   HGBA1C 5.9 (H) 11/01/2020   Lab Results  Component Value Date   INSULIN 68.2 (H) 06/03/2022   INSULIN 39.9 (H) 03/05/2022   INSULIN 35.2 (H) 06/18/2021   INSULIN 26.3 (H) 02/18/2021   INSULIN 34.4 (H) 11/01/2020   Lab Results  Component Value Date   TSH 4.490 06/18/2021   Lab Results  Component Value Date   CHOL 195 06/03/2022   HDL 53 06/03/2022   LDLCALC  123 (H) 06/03/2022   TRIG 107 06/03/2022   CHOLHDL 4.3 11/01/2020   Lab Results  Component Value Date   VD25OH 15.2 (L) 06/03/2022   VD25OH 17.5 (L) 03/05/2022   VD25OH 16.5 (L) 12/11/2021   Lab Results  Component Value Date   WBC 4.6 06/18/2021   HGB 14.0 06/18/2021   HCT 43.0 06/18/2021   MCV 80 06/18/2021   PLT 285 06/18/2021   No results found for: "IRON", "TIBC", "FERRITIN"  Attestation Statements:   Reviewed by clinician on day of visit: allergies, medications, problem list, medical history, surgical history, family history, social history, and previous encounter notes.   I, Burt Knack, am acting as transcriptionist for Reuben Likes, MD.  I have reviewed the above documentation for accuracy and completeness, and I agree with the above. - Reuben Likes, MD

## 2022-12-23 ENCOUNTER — Ambulatory Visit (INDEPENDENT_AMBULATORY_CARE_PROVIDER_SITE_OTHER): Payer: Medicaid Other | Admitting: Family Medicine

## 2022-12-29 ENCOUNTER — Encounter (INDEPENDENT_AMBULATORY_CARE_PROVIDER_SITE_OTHER): Payer: Self-pay | Admitting: Family Medicine

## 2022-12-29 ENCOUNTER — Telehealth (INDEPENDENT_AMBULATORY_CARE_PROVIDER_SITE_OTHER): Payer: Medicaid Other | Admitting: Family Medicine

## 2022-12-29 DIAGNOSIS — Z7985 Long-term (current) use of injectable non-insulin antidiabetic drugs: Secondary | ICD-10-CM

## 2022-12-29 DIAGNOSIS — E1169 Type 2 diabetes mellitus with other specified complication: Secondary | ICD-10-CM | POA: Diagnosis not present

## 2022-12-29 DIAGNOSIS — E559 Vitamin D deficiency, unspecified: Secondary | ICD-10-CM | POA: Diagnosis not present

## 2022-12-29 DIAGNOSIS — Z6841 Body Mass Index (BMI) 40.0 and over, adult: Secondary | ICD-10-CM

## 2022-12-29 DIAGNOSIS — E669 Obesity, unspecified: Secondary | ICD-10-CM

## 2022-12-29 MED ORDER — OZEMPIC (0.25 OR 0.5 MG/DOSE) 2 MG/3ML ~~LOC~~ SOPN
0.5000 mg | PEN_INJECTOR | SUBCUTANEOUS | 0 refills | Status: DC
Start: 2022-12-29 — End: 2023-02-16

## 2022-12-29 MED ORDER — VITAMIN D (ERGOCALCIFEROL) 1.25 MG (50000 UNIT) PO CAPS
50000.0000 [IU] | ORAL_CAPSULE | ORAL | 0 refills | Status: DC
Start: 2022-12-29 — End: 2023-02-16

## 2022-12-29 NOTE — Progress Notes (Signed)
TeleHealth Visit:  This visit was completed with telemedicine (audio/video) technology. James Gross has verbally consented to this TeleHealth visit. The patient is located at home, the provider is located at home. The participants in this visit include the listed provider and patient. The visit was conducted today via MyChart video.  OBESITY James Gross is here to discuss his progress with his obesity treatment plan along with follow-up of his obesity related diagnoses.   Today's visit was # 51 Starting weight: 471 lbs Starting date: 02/28/2020 Weight at last in office visit: 471 lbs on 12/02/22 Total weight loss: 0 lbs at last in office visit on 12/02/22. Today's reported weight (12/29/22): none reported  Nutrition Plan: keeping a food journal with goal of 1900 calories and 160 grams of protein daily   Current exercise:  none  Interim History:  He is currently out of town caring for his cousin who had surgery so he had to cancel his in office visit.  He is doing better with journaling. He is journaling even when he does not eat well. He has been out of town and has not hit his goals.   When at home he does better and can often hit calorie and protein goals. He has eaten out more than usual recently.  Drinking diet soda and diet juice. He is not a big snacker but portions are often large  He started Ozempic but has not noticed a difference in appetite.  Had a little bit of stomach upset with the first dose.  He is doing summer classes at [employer].   Assessment/Plan:  1. Type 2 Diabetes Mellitus with other specified complication, without long-term current use of insulin HgbA1c is at goal. Last A1c was 6.0 CBGs: Not checking Episodes of hypoglycemia: no Medication(s): Ozempic 0.25 mg SQ weekly.  Did not experience appetite suppression or better satiety at this dose. Mild GI upset with first injection.  Has taken 2 doses.  Lab Results  Component Value Date   HGBA1C 6.0 (H) 06/03/2022    HGBA1C 5.6 12/11/2021   HGBA1C 5.8 (H) 06/18/2021   Lab Results  Component Value Date   LDLCALC 123 (H) 06/03/2022   CREATININE 0.81 06/03/2022   No results found for: "GFR"  Plan: Continue and increase dose Ozempic 0.5 mg SQ weekly Check A1c next office visit.  2. Vitamin D Deficiency Vitamin D is not at goal of 50.  Most recent vitamin D level was very low at 15.2. He has not been taking vitamin D because he did not pick it up from the pharmacy. Reports low energy.  Lab Results  Component Value Date   VD25OH 15.2 (L) 06/03/2022   VD25OH 17.5 (L) 03/05/2022   VD25OH 16.5 (L) 12/11/2021    Plan: Continue and refill  prescription ergocalciferol 50,000 IU weekly Encouraged good compliance. Check vitamin D level next office visit.   3. Morbid Obesity: Current BMI 71  James Gross is currently in the action stage of change. As such, his goal is to continue with weight loss efforts.  He has agreed to keeping a food journal with goal of 1900 calories and 160 grams of protein daily.  Exercise goals: No exercise has been prescribed at this time.  Behavioral modification strategies: increasing lean protein intake, decreasing simple carbohydrates , planning for success, keep a strict food journal, and increase frequency of journaling.  James Gross has agreed to follow-up with our clinic in 4 weeks with fasting labs.  No orders of the defined types were placed in this  encounter.   Medications Discontinued During This Encounter  Medication Reason   Semaglutide,0.25 or 0.5MG /DOS, (OZEMPIC, 0.25 OR 0.5 MG/DOSE,) 2 MG/3ML SOPN Reorder   Vitamin D, Ergocalciferol, (DRISDOL) 1.25 MG (50000 UNIT) CAPS capsule Reorder     Meds ordered this encounter  Medications   Semaglutide,0.25 or 0.5MG /DOS, (OZEMPIC, 0.25 OR 0.5 MG/DOSE,) 2 MG/3ML SOPN    Sig: Inject 0.5 mg into the skin once a week.    Dispense:  3 mL    Refill:  0    Order Specific Question:   Supervising Provider    Answer:   Carolin Sicks   Vitamin D, Ergocalciferol, (DRISDOL) 1.25 MG (50000 UNIT) CAPS capsule    Sig: Take 1 capsule (50,000 Units total) by mouth every 7 (seven) days. Q Thurs and Mon    Dispense:  4 capsule    Refill:  0    30 d supply;  ** OV for RF **   Do not send RF request    Order Specific Question:   Supervising Provider    Answer:   Glennis Brink [[id number]]      Objective:   VITALS: Per patient if applicable, see vitals. GENERAL: Alert and in no acute distress. CARDIOPULMONARY: No increased WOB. Speaking in clear sentences.  PSYCH: Pleasant and cooperative. Speech normal rate and rhythm. Affect is appropriate. Insight and judgement are appropriate. Attention is focused, linear, and appropriate.  NEURO: Oriented as arrived to appointment on time with no prompting.   Attestation Statements:   Reviewed by clinician on day of visit: allergies, medications, problem list, medical history, surgical history, family history, social history, and previous encounter notes.   This was prepared with the assistance of Engineer, civil (consulting).  Occasional wrong-word or sound-a-like substitutions may have occurred due to the inherent limitations of voice recognition software.

## 2023-01-27 ENCOUNTER — Ambulatory Visit (INDEPENDENT_AMBULATORY_CARE_PROVIDER_SITE_OTHER): Payer: Medicaid Other | Admitting: Family Medicine

## 2023-02-16 ENCOUNTER — Encounter (INDEPENDENT_AMBULATORY_CARE_PROVIDER_SITE_OTHER): Payer: Self-pay | Admitting: Family Medicine

## 2023-02-16 ENCOUNTER — Ambulatory Visit (INDEPENDENT_AMBULATORY_CARE_PROVIDER_SITE_OTHER): Payer: Medicaid Other | Admitting: Family Medicine

## 2023-02-16 DIAGNOSIS — E559 Vitamin D deficiency, unspecified: Secondary | ICD-10-CM | POA: Diagnosis not present

## 2023-02-16 DIAGNOSIS — E1169 Type 2 diabetes mellitus with other specified complication: Secondary | ICD-10-CM | POA: Diagnosis not present

## 2023-02-16 DIAGNOSIS — Z6841 Body Mass Index (BMI) 40.0 and over, adult: Secondary | ICD-10-CM

## 2023-02-16 DIAGNOSIS — Z7985 Long-term (current) use of injectable non-insulin antidiabetic drugs: Secondary | ICD-10-CM

## 2023-02-16 DIAGNOSIS — K760 Fatty (change of) liver, not elsewhere classified: Secondary | ICD-10-CM | POA: Diagnosis not present

## 2023-02-16 DIAGNOSIS — E538 Deficiency of other specified B group vitamins: Secondary | ICD-10-CM

## 2023-02-16 DIAGNOSIS — Z7984 Long term (current) use of oral hypoglycemic drugs: Secondary | ICD-10-CM

## 2023-02-16 DIAGNOSIS — E785 Hyperlipidemia, unspecified: Secondary | ICD-10-CM

## 2023-02-16 DIAGNOSIS — E669 Obesity, unspecified: Secondary | ICD-10-CM

## 2023-02-16 MED ORDER — VITAMIN D (ERGOCALCIFEROL) 1.25 MG (50000 UNIT) PO CAPS
ORAL_CAPSULE | ORAL | 1 refills | Status: AC
Start: 2023-02-16 — End: ?

## 2023-02-16 MED ORDER — SEMAGLUTIDE (1 MG/DOSE) 4 MG/3ML ~~LOC~~ SOPN: 1.0000 mg | PEN_INJECTOR | SUBCUTANEOUS | 1 refills | Status: AC

## 2023-02-16 MED ORDER — METFORMIN HCL 500 MG PO TABS
500.0000 mg | ORAL_TABLET | Freq: Two times a day (BID) | ORAL | 1 refills | Status: AC
Start: 2023-02-16 — End: ?

## 2023-02-16 NOTE — Progress Notes (Signed)
Carlye Grippe, D.O.  ABFM, ABOM Specializing in Clinical Bariatric Medicine  Office located at: [address] [place], [place]  [address]     Assessment and Plan:  Review fasting labs next OV.  Orders Placed This Encounter  Procedures   Vitamin B12   VITAMIN D 25 Hydroxy (Vit-D Deficiency, Fractures)   T4, free   TSH   Magnesium   Lipid panel   Insulin, random   Hemoglobin A1c   Comprehensive metabolic panel   CBC with Differential/Platelet   Medications Discontinued During This Encounter  Medication Reason   Semaglutide,0.25 or 0.5MG /DOS, (OZEMPIC, 0.25 OR 0.5 MG/DOSE,) 2 MG/3ML SOPN    metFORMIN (GLUCOPHAGE) 500 MG tablet Reorder   Vitamin D, Ergocalciferol, (DRISDOL) 1.25 MG (50000 UNIT) CAPS capsule Reorder    Meds ordered this encounter  Medications   metFORMIN (GLUCOPHAGE) 500 MG tablet    Sig: Take 1 tablet (500 mg total) by mouth 2 (two) times daily with a meal.    Dispense:  60 tablet    Refill:  1   Vitamin D, Ergocalciferol, (DRISDOL) 1.25 MG (50000 UNIT) CAPS capsule    Sig: 1 po Q Thurs and Mon every week    Dispense:  8 capsule    Refill:  1    30 d supply;  ** OV for RF **   Do not send RF request   Semaglutide, 1 MG/DOSE, 4 MG/3ML SOPN    Sig: Inject 1 mg as directed once a week.    Dispense:  3 mL    Refill:  1    Type 2 diabetes mellitus with obesity (HCC) Assessment: Condition is Not at goal.. Pt has not been consistent with taking his Ozempic injections, he had his dosage increase from 0.25mg  to 0.5mg . He is more consistent with taking his Metformin. When he does take it he only experiences a slight tummy ache.  Lab Results  Component Value Date   HGBA1C 6.0 (H) 06/03/2022   HGBA1C 5.6 12/11/2021   HGBA1C 5.8 (H) 06/18/2021   INSULIN 68.2 (H) 06/03/2022   INSULIN 39.9 (H) 03/05/2022   INSULIN 35.2 (H) 06/18/2021    Plan: - Continue Metformin 500mg  more consistently. I will refill this today.   - Switch his Semaglutide from 0.50  to 1mg  and begin taking it more consistently. I will refill this today.   - I gave pt a handout on how to use Semaglutide click doses where 36 clicks is 0.50 so he needs to complete 72 clicks for 1mg  dosage.  - Intensive lifestyle modification including diet, exercise and weight loss are the first line of treatment for diabetes. We extensively discussed the importance of decreasing simple carbs, increasing proteins and how certain foods they eat will affect their blood sugars  - Recheck labs in 3 months if not done at Endo provider / PCP.    Vitamin D deficiency Assessment: Condition is Not at goal.. Pt levels are very low below the recommended average vitamin D range. He has not been taking vitamin D supplement.  Lab Results  Component Value Date   VD25OH 15.2 (L) 06/03/2022   VD25OH 17.5 (L) 03/05/2022   VD25OH 16.5 (L) 12/11/2021   Plan: - I will switch his Ergocalciferol 50K IU twice weekly. I will refill this today.   - I discussed the importance of vitamin D to the patient's health and well-being as well as to their ability to lose weight.   - Informed patient this may  be a lifelong thing, and he was encouraged to continue to take the medicine until told otherwise.     - weight loss will likely improve availability of vitamin D, thus encouraged James Gross to continue with meal plan and their weight loss efforts to further improve this condition.  Thus, we will need to monitor levels regularly (every 3-4 mo on average) to keep levels within normal limits and prevent over supplementation.   TREATMENT PLAN FOR OBESITY: Morbid obesity (HCC): Current BMI 72.85 Assessment:  James Gross is here to discuss his progress with his obesity treatment plan along with follow-up of his obesity related diagnoses. See Medical Weight Management Flowsheet for complete bioelectrical impedance results.  Condition is not optimized. Biometric data collected today, was reviewed with patient.   Since last  office visit on 12/02/2022 patient's  Muscle mass has decreased by 8lb. Fat mass has increased by 16.8lb. Counseling done on how various foods will affect these numbers and how to maximize success  Total lbs lost to date: +8lbs Total weight loss percentage to date: +1.70%   Plan: - Restart keeping a food journal and we will switch his calorie intake to of 2100-2200 calories and 160+ protein.    - I informed him of healthy lean protein options.  Behavioral Intervention Additional resources provided today:   Provided hand out on proper plate portions, healthy complex carbs and sources of protein as well as generaly healthy eating concepts.  Evidence-based interventions for health behavior change were utilized today including the discussion of self monitoring techniques, problem-solving barriers and SMART goal setting techniques.   Regarding patient's less desirable eating habits and patterns, we employed the technique of small changes.  Pt will specifically work on: Journaling his food intake and taking his prescribed medication regularly for next visit.    Recommended Physical Activity Goals  Farhan has been advised to slowly work up to 150 minutes of moderate intensity aerobic activity a week and strengthening exercises 2-3 times per week for cardiovascular health, weight loss maintenance and preservation of muscle mass.   He has agreed to Think about ways to increase daily physical activity and overcoming barriers to exercise   FOLLOW UP: Return in about 3 weeks (around 03/09/2023).  He was informed of the importance of frequent follow up visits to maximize his success with intensive lifestyle modifications for his multiple health conditions. Joellen Jersey is aware that we will review all of his lab results at our next visit together in person.  He is aware that if anything is critical/ life threatening with the results, we will be contacting him via MyChart or by my CMA will be calling them  prior to the office visit to discuss acute management.    Subjective:   Chief complaint: Obesity James Gross is here to discuss his progress with his obesity treatment plan. He is on the keeping a food journal and adhering to recommended goals of 2100-2200 calories and 160 protein and states he is following his eating plan approximately 0% of the time. He states he is not exercising.  Interval History:  James Gross is here for a follow up office visit.     Since last OV he has been traveling a lot this summer and currently finished summer classes. He informed me that his clothes are fitting tighter and it is getting harder to do things. He has not been journaling or following the meal plan during the summer but he notes wanting to restart his weight  loss journey. Pt informed me that after he gets his associates degree he wants to go to school [employer], although he knows he does best with his weight los journey here he wants to attend online at home.   Pharmacotherapy for weight loss: He is currently taking  Ozempic and Metformin  for medical weight loss.  Denies side effects.    Review of Systems:  Pertinent positives were addressed with patient today.   Reviewed by clinician on day of visit: allergies, medications, problem list, medical history, surgical history, family history, social history, and previous encounter notes.  Weight Summary and Biometrics   Weight Lost Since Last Visit: 0lb  Weight Gained Since Last Visit: 8lb   Vitals Temp: 98.2 F (36.8 C) BP: 138/81 Pulse Rate: 79 SpO2: 100 %   Anthropometric Measurements Height: 5\' 8"  (1.727 m) Weight: (!) 479 lb (217.3 kg) BMI (Calculated): 72.85 Weight at Last Visit: 471lb Weight Lost Since Last Visit: 0lb Weight Gained Since Last Visit: 8lb Starting Weight: 471lb Total Weight Loss (lbs): 0 lb (0 kg) Peak Weight: 471lb   Body Composition  Body Fat %: 60 % Fat Mass (lbs): 287.8 lbs Muscle Mass (lbs): 182.6  lbs Visceral Fat Rating : 56   Other Clinical Data Fasting: yes Labs: yes Today's Visit #: 52 Starting Date: 02/28/20     Objective:   PHYSICAL EXAM: Blood pressure 138/81, pulse 79, temperature 98.2 F (36.8 C), height 5\' 8"  (1.727 m), weight (!) 479 lb (217.3 kg), SpO2 100%. Body mass index is 72.83 kg/m.  General: Well Developed, well nourished, and in no acute distress.  HEENT: Normocephalic, atraumatic Skin: Warm and dry, cap RF less 2 sec, good turgor Chest:  Normal excursion, shape, no gross abn Respiratory: speaking in full sentences, no conversational dyspnea NeuroM-Sk: Ambulates w/o assistance, moves * 4 Psych: A and O *3, insight good, mood-full  DIAGNOSTIC DATA REVIEWED:  BMET    Component Value Date/Time   NA 140 06/03/2022 0847   K 4.5 06/03/2022 0847   CL 103 06/03/2022 0847   CO2 23 06/03/2022 0847   GLUCOSE 103 (H) 06/03/2022 0847   BUN 15 06/03/2022 0847   CREATININE 0.81 06/03/2022 0847   CALCIUM 9.4 06/03/2022 0847   GFRNONAA 141 02/28/2020 1104   GFRAA 163 02/28/2020 1104   Lab Results  Component Value Date   HGBA1C 6.0 (H) 06/03/2022   HGBA1C 5.9 07/27/2012   Lab Results  Component Value Date   INSULIN 68.2 (H) 06/03/2022   INSULIN 80.6 (H) 02/28/2020   Lab Results  Component Value Date   TSH 4.490 06/18/2021   CBC    Component Value Date/Time   WBC 4.6 06/18/2021 0953   RBC 5.39 06/18/2021 0953   HGB 14.0 06/18/2021 0953   HCT 43.0 06/18/2021 0953   PLT 285 06/18/2021 0953   MCV 80 06/18/2021 0953   MCH 26.0 (L) 06/18/2021 0953   MCHC 32.6 06/18/2021 0953   RDW 13.9 06/18/2021 0953   Iron Studies No results found for: "IRON", "TIBC", "FERRITIN", "IRONPCTSAT" Lipid Panel     Component Value Date/Time   CHOL 195 06/03/2022 0847   TRIG 107 06/03/2022 0847   HDL 53 06/03/2022 0847   CHOLHDL 4.3 11/01/2020 0933   LDLCALC 123 (H) 06/03/2022 0847   Hepatic Function Panel     Component Value Date/Time   PROT 7.3  06/03/2022 0847   ALBUMIN 4.0 (L) 06/03/2022 0847   AST 22 06/03/2022 0847   ALT 38 06/03/2022  0847   ALKPHOS 85 06/03/2022 0847   BILITOT <0.2 06/03/2022 0847      Component Value Date/Time   TSH 4.490 06/18/2021 0953   Nutritional Lab Results  Component Value Date   VD25OH 15.2 (L) 06/03/2022   VD25OH 17.5 (L) 03/05/2022   VD25OH 16.5 (L) 12/11/2021    Attestations:   This encounter took 40 total minutes of time including any pre-visit and post-visit time spent on this date of service, including taking a thorough history, reviewing any labs and/or imaging, reviewing prior notes, as well as documenting in the electronic health record on the date of service. Over 50% of that time was in direct face-to-face counseling and coordinating care for the patient today  I, Clinical biochemist, acting as a Stage manager for [employer], DO., have compiled all relevant documentation for today's office visit on behalf of Thomasene Lot, DO, while in the presence of [employer], DO.  I have reviewed the above documentation for accuracy and completeness, and I agree with the above. Carlye Grippe, D.O.  The 21st Century Cures Act was signed into law in 2016 which includes the topic of electronic health records.  This provides immediate access to information in MyChart.  This includes consultation notes, operative notes, office notes, lab results and pathology reports.  If you have any questions about what you read please let us know at your next visit so we can discuss your concerns and take corrective action if need be.  We are right here with you.

## 2023-03-16 ENCOUNTER — Ambulatory Visit (INDEPENDENT_AMBULATORY_CARE_PROVIDER_SITE_OTHER): Payer: Medicaid Other | Admitting: Physician Assistant

## 2023-10-20 ENCOUNTER — Ambulatory Visit: Payer: Medicaid Other | Admitting: Internal Medicine

## 2023-12-25 NOTE — Progress Notes (Deleted)
 HPI M never smoker followed for OSA, complicated by Morbid Obesity, PreDiabetes NPSG 03/27/19  AHI 169.9/ hr, desaturation to 66%, Body weight 430 lbs, CPAP titrated to 16  =====================================================================  10/20/22- 21 yoM never smoker followed for OSA, complicated by Morbid Obesity, DM2,  CPAP auto 12-18/ [employer] Download- compliance 23%, AHI 1.3/ hr Body weight today- 467 lbs -----No issues with cpap machine. Pt is doing well Download reviewed.  Still having trouble being compliant with CPAP use.  He says he falls asleep before he puts it on.  I suspect there is a general pattern of for self-discipline and likely significant depression. We discussed the medical reasons for treating sleep apnea, treatment options and the importance of body weight again. 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  12/28/23- 22 yoM never smoker followed for OSA, complicated by Morbid Obesity, DM2,  CPAP auto 12-18/ [employer] Download- compliance  Body weight today-  ROS-see HPI   + = positive Constitutional:    +diet/weight loss, night sweats, fevers, chills, fatigue, lassitude. HEENT:    headaches, difficulty swallowing, tooth/dental problems, sore throat,       sneezing, itching, ear ache, nasal congestion, post nasal drip, snoring CV:    chest pain, orthopnea, PND, swelling in lower extremities, anasarca,                                   dizziness, palpitations Resp:   shortness of breath with exertion or at rest.                productive cough,   non-productive cough, coughing up of blood.              change in color of mucus.  wheezing.   Skin:    rash or lesions. GI:  No-   heartburn, indigestion, abdominal pain, nausea, vomiting, diarrhea,                 change in bowel habits, loss of appetite GU: dysuria, change in color of urine, no urgency or frequency.   flank pain. MS:   joint pain, stiffness, decreased  range of motion, back pain. Neuro-     nothing unusual Psych:  change in mood or affect.  depression or anxiety.   memory loss.  OBJ- Physical Exam General- Alert, Oriented, Affect-appropriate, Distress- none acute, + morbid obesity Skin- rash-none, lesions- none, excoriation- none Lymphadenopathy- none Head- atraumatic            Eyes- Gross vision intact, PERRLA, conjunctivae and secretions clear            Ears- Hearing, canals-normal            Nose- Clear, no-Septal dev, mucus, polyps, erosion, perforation             Throat- Mallampati III-IV , mucosa clear , drainage- none, tonsils+, + teeth Neck- flexible , trachea midline, no stridor , thyroid nl, carotid no bruit Chest - symmetrical excursion , unlabored           Heart/CV- RRR , no murmur , no gallop  , no rub, nl s1 s2                           - JVD- none , edema- none, stasis changes- none, varices- none  Lung- clear to P&A, wheeze- none, cough- none , dullness-none, rub- none           Chest wall-  Abd-  Br/ Gen/ Rectal- Not done, not indicated Extrem- cyanosis- none, clubbing, none, atrophy- none, strength- nl Neuro- grossly intact to observation

## 2023-12-28 ENCOUNTER — Ambulatory Visit: Admitting: Internal Medicine

## 2024-02-02 NOTE — Procedures (Signed)
Mask fit
